# Patient Record
Sex: Female | Born: 1960 | Hispanic: No | Marital: Single | Smoking: Never smoker
Health system: Southern US, Community
[De-identification: ages and names within clinical notes are randomized; demographics above are authoritative.]

## PROBLEM LIST (undated history)

## (undated) DIAGNOSIS — R87619 Unspecified abnormal cytological findings in specimens from cervix uteri: Secondary | ICD-10-CM

## (undated) DIAGNOSIS — J45909 Unspecified asthma, uncomplicated: Secondary | ICD-10-CM

## (undated) DIAGNOSIS — J449 Chronic obstructive pulmonary disease, unspecified: Secondary | ICD-10-CM

## (undated) DIAGNOSIS — I82409 Acute embolism and thrombosis of unspecified deep veins of unspecified lower extremity: Secondary | ICD-10-CM

## (undated) DIAGNOSIS — IMO0002 Reserved for concepts with insufficient information to code with codable children: Secondary | ICD-10-CM

## (undated) DIAGNOSIS — I10 Essential (primary) hypertension: Secondary | ICD-10-CM

## (undated) DIAGNOSIS — I2699 Other pulmonary embolism without acute cor pulmonale: Secondary | ICD-10-CM

## (undated) HISTORY — PX: FOOT SURGERY: SHX648

## (undated) HISTORY — PX: OTHER SURGICAL HISTORY: SHX169

## (undated) HISTORY — DX: Reserved for concepts with insufficient information to code with codable children: IMO0002

## (undated) HISTORY — DX: Unspecified abnormal cytological findings in specimens from cervix uteri: R87.619

## (undated) HISTORY — DX: Other pulmonary embolism without acute cor pulmonale: I26.99

## (undated) HISTORY — PX: FOOT TENDON SURGERY: SHX958

## (undated) HISTORY — DX: Essential (primary) hypertension: I10

## (undated) HISTORY — DX: Acute embolism and thrombosis of unspecified deep veins of unspecified lower extremity: I82.409

## (undated) HISTORY — PX: TUBAL LIGATION: SHX77

---

## 2010-02-21 ENCOUNTER — Ambulatory Visit: Payer: Self-pay | Admitting: Diagnostic Radiology

## 2010-02-21 ENCOUNTER — Emergency Department (HOSPITAL_BASED_OUTPATIENT_CLINIC_OR_DEPARTMENT_OTHER): Admission: EM | Admit: 2010-02-21 | Discharge: 2010-02-21 | Payer: Self-pay | Admitting: Emergency Medicine

## 2010-09-09 ENCOUNTER — Emergency Department (HOSPITAL_BASED_OUTPATIENT_CLINIC_OR_DEPARTMENT_OTHER)
Admission: EM | Admit: 2010-09-09 | Discharge: 2010-09-09 | Payer: Self-pay | Source: Home / Self Care | Admitting: Emergency Medicine

## 2012-01-07 ENCOUNTER — Other Ambulatory Visit: Payer: Self-pay | Admitting: Obstetrics and Gynecology

## 2012-01-07 DIAGNOSIS — Z1231 Encounter for screening mammogram for malignant neoplasm of breast: Secondary | ICD-10-CM

## 2012-01-12 ENCOUNTER — Encounter: Payer: Self-pay | Admitting: *Deleted

## 2012-01-12 ENCOUNTER — Other Ambulatory Visit: Payer: Self-pay | Admitting: Obstetrics and Gynecology

## 2012-01-12 ENCOUNTER — Ambulatory Visit (HOSPITAL_COMMUNITY): Payer: Self-pay

## 2012-01-12 ENCOUNTER — Ambulatory Visit (INDEPENDENT_AMBULATORY_CARE_PROVIDER_SITE_OTHER): Payer: Self-pay | Admitting: *Deleted

## 2012-01-12 VITALS — BP 151/88 | HR 95 | Temp 98.3°F | Ht 67.0 in | Wt 233.5 lb

## 2012-01-12 DIAGNOSIS — Z1239 Encounter for other screening for malignant neoplasm of breast: Secondary | ICD-10-CM

## 2012-01-12 DIAGNOSIS — N6452 Nipple discharge: Secondary | ICD-10-CM

## 2012-01-12 DIAGNOSIS — N6459 Other signs and symptoms in breast: Secondary | ICD-10-CM

## 2012-01-12 NOTE — Progress Notes (Signed)
Complaints of bilateral nipple discharge.  Pap Smear:    Pap smear not performed today. Patients last Pap smear was 12/14/11 at the free cervical cancer screening at [facility] and normal. Per patient she had one abnormal Pap smear around 20 years ago that required cryo. No Pap smear results in EPIC.  Physical exam: Breasts Breasts symmetrical. No skin abnormalities bilateral breasts. No nipple retraction bilateral breasts. Yellowish colored bilateral nipple discharge when nipples are squeezed. Per patient has been having this discharge for around 10 years. Specimen sent to cytology. No lymphadenopathy. No lumps palpated bilateral breasts. No complaints of pain or tenderness on exam. Patient referred to the [facility] for Diagnostic Mammogram. Appointment scheduled for Thursday, Jan 14, 2012 at 1520.         Pelvic/Bimanual No Pap smear completed today since last Pap smear was 12/14/11 and normal. Pap smear not indicated per BCCCP guidelines.

## 2012-01-12 NOTE — Patient Instructions (Signed)
Taught patient how to perform BSE. Patient did not need a Pap smear today due to last Pap smear was 12/14/11. Let her know BCCCP will cover Pap smears every 3 years unless has a history of abnormal Pap smears. Since patients abnormal Pap smear was 20 years told patient is okay to wait 3 years for next Pap smear or if is concerned and would like one sooner can come to one of the free cervical cancer screenings offered in the spring or fall. Patient referred to the [facility] for Diagnostic Mammogram. Appointment scheduled for Thursday, Jan 14, 2012 at 1520. Patient aware of appointment and will be there. Let patient know will follow up with her within the next couple weeks with results. Patient verbalized understanding.

## 2012-01-18 ENCOUNTER — Ambulatory Visit
Admission: RE | Admit: 2012-01-18 | Discharge: 2012-01-18 | Disposition: A | Discharge status: Home / Self Care | Payer: No Typology Code available for payment source | Source: Ambulatory Visit | Attending: Obstetrics and Gynecology | Admitting: Obstetrics and Gynecology

## 2012-01-18 DIAGNOSIS — N6452 Nipple discharge: Secondary | ICD-10-CM

## 2012-01-19 ENCOUNTER — Telehealth: Payer: Self-pay

## 2012-01-19 NOTE — Telephone Encounter (Signed)
Called pt and left message to return the call to Christine Brannock @ [phone].  

## 2012-01-26 ENCOUNTER — Telehealth: Payer: Self-pay

## 2012-01-26 NOTE — Telephone Encounter (Signed)
Called pt and informed pt of no malignancy of nipple discharge and that she was recommended to do yearly screening mammograms.  Pt stated understanding and had no further questions.

## 2012-08-05 ENCOUNTER — Encounter (HOSPITAL_COMMUNITY): Payer: Self-pay

## 2012-08-05 ENCOUNTER — Emergency Department (HOSPITAL_COMMUNITY)
Admission: EM | Admit: 2012-08-05 | Discharge: 2012-08-05 | Disposition: A | Discharge status: Home / Self Care | Payer: Self-pay | Source: Home / Self Care

## 2012-08-05 DIAGNOSIS — M79604 Pain in right leg: Secondary | ICD-10-CM

## 2012-08-05 DIAGNOSIS — I10 Essential (primary) hypertension: Secondary | ICD-10-CM

## 2012-08-05 DIAGNOSIS — M79609 Pain in unspecified limb: Secondary | ICD-10-CM

## 2012-08-05 LAB — CBC WITH DIFFERENTIAL/PLATELET
Eosinophils Absolute: 0.2 10*3/uL (ref 0.0–0.7)
HCT: 44.5 % (ref 36.0–46.0)
Hemoglobin: 14.8 g/dL (ref 12.0–15.0)
Lymphocytes Relative: 26 % (ref 12–46)
Lymphs Abs: 2.3 10*3/uL (ref 0.7–4.0)
MCH: 29 pg (ref 26.0–34.0)
MCHC: 33.3 g/dL (ref 30.0–36.0)
MCV: 87.3 fL (ref 78.0–100.0)
Monocytes Relative: 7 % (ref 3–12)
Neutro Abs: 5.7 10*3/uL (ref 1.7–7.7)
WBC: 8.9 10*3/uL (ref 4.0–10.5)

## 2012-08-05 LAB — BASIC METABOLIC PANEL
Calcium: 10 mg/dL (ref 8.4–10.5)
Chloride: 98 mEq/L (ref 96–112)
Creatinine, Ser: 0.53 mg/dL (ref 0.50–1.10)

## 2012-08-05 MED ORDER — MELOXICAM 7.5 MG PO TABS: 7.5000 mg | ORAL_TABLET | Freq: Two times a day (BID) | ORAL | Status: DC

## 2012-08-05 MED ORDER — HYDROCHLOROTHIAZIDE 25 MG PO TABS: 25.0000 mg | ORAL_TABLET | Freq: Every day | ORAL | Status: DC

## 2012-08-05 NOTE — ED Notes (Signed)
Patient c/o pain in both legs from knees down.  Heel of Left foot is swollen. Has history of surgery to left foot.Back of right heel is swollen as well

## 2012-08-05 NOTE — ED Provider Notes (Signed)
History     CSN: [id number]  Arrival date & time 08/05/12  1555   First MD Initiated Contact with Patient 08/05/12 1727      Chief Complaint  Patient presents with  . Leg Pain   Ms. Kowaleski has had history of surgery in both her legs. Her left ankle was cutoff at age 50 and was reconstructed by taking skin from her right thigh. Her left leg had a history of blood clot and she says that one of her pains was taken out a few years ago in [place]. Patient has been here in [place] for last 6 years and has not had any primary care physician. She sees that both her feets have started hurting. The pain is located at the bottom of both her feet, nonradiating, sharp or burning in quality, persistent throughout the day, progressively getting worse, associated with some swelling in her left foot and swelling in right foot and calf. Patient had been on blood pressure medication but has been unable to get a refill.   (Consider location/radiation/quality/duration/timing/severity/associated sxs/prior treatment) HPI  Past Medical History  Diagnosis Date  . Abnormal Pap smear     cryotherapy  . Hypertension     Past Surgical History  Procedure Date  . Tubal ligation   . Skin graphs   . Vein removed     Family History  Problem Relation Age of Onset  . Breast cancer Maternal Grandmother   . Diabetes Father   . Heart disease Father   . Diabetes Brother   . Hypertension Daughter     History  Substance Use Topics  . Smoking status: Never Smoker   . Smokeless tobacco: Never Used  . Alcohol Use: No    OB History    Grav Para Term Preterm Abortions TAB SAB Ect Mult Living   6 6 6       6       Review of Systems  Constitutional: Negative for fever, activity change and appetite change.  HENT: Negative for sore throat.   Respiratory: Negative for cough and shortness of breath.   Cardiovascular: Negative for chest pain and leg swelling.  Gastrointestinal: Negative for nausea, abdominal  pain, diarrhea, constipation and abdominal distention.  Genitourinary: Negative for frequency, hematuria and difficulty urinating.  Musculoskeletal: Positive for myalgias, joint swelling and arthralgias.  Neurological: Negative for dizziness and headaches.  Psychiatric/Behavioral: Negative for suicidal ideas and behavioral problems.    Allergies  Review of patient's allergies indicates no known allergies.  Home Medications   Current Outpatient Rx  Name  Route  Sig  Dispense  Refill  . HYDROCHLOROTHIAZIDE 25 MG PO TABS   Oral   Take 25 mg by mouth daily.           BP 148/91  Pulse 110  Temp 98.6 F (37 C) (Oral)  Resp 21  SpO2 100%  LMP 07/07/2012  Physical Exam  Constitutional: She is oriented to person, place, and time. She appears well-developed and well-nourished.  HENT:  Head: Normocephalic and atraumatic.  Eyes: Conjunctivae normal and EOM are normal. Pupils are equal, round, and reactive to light. No scleral icterus.  Neck: Normal range of motion. Neck supple. No JVD present. No thyromegaly present.  Cardiovascular: Normal rate, regular rhythm, normal heart sounds and intact distal pulses.  Exam reveals no gallop and no friction rub.   No murmur heard. Pulmonary/Chest: Effort normal and breath sounds normal. No respiratory distress. She has no wheezes. She has no rales.  Abdominal: Soft. Bowel sounds are normal. She exhibits no distension and no mass. There is no tenderness. There is no rebound and no guarding.  Musculoskeletal: Normal range of motion. She exhibits edema (bilateral 1+ pitting edema up to mid shin).       Legs:      Feet:  Lymphadenopathy:    She has no cervical adenopathy.  Neurological: She is alert and oriented to person, place, and time.  Psychiatric: She has a normal mood and affect. Her behavior is normal.    ED Course  Procedures (including critical care time)  Labs Reviewed - No data to display No results found.   No diagnosis  found.    MDM  Patient most likely has increased swelling from dis continuation of her HCTZ. There is no signs of an active infection at this time. I will do basic metabolic profile and CBC with differential. I will also put in the future order for x-ray of her bilateral ankle joints. Followup as needed.        Lars Mage, MD 08/05/12 1757

## 2012-08-09 NOTE — ED Notes (Signed)
Spoke with patient today.  bloodwork was normal sugar a little elevated  Can obtain ted stockings at local pharmacy.

## 2013-03-22 ENCOUNTER — Emergency Department (HOSPITAL_BASED_OUTPATIENT_CLINIC_OR_DEPARTMENT_OTHER)
Admission: EM | Admit: 2013-03-22 | Discharge: 2013-03-22 | Disposition: A | Discharge status: Home / Self Care | Payer: Self-pay | Attending: Emergency Medicine | Admitting: Emergency Medicine

## 2013-03-22 ENCOUNTER — Emergency Department (HOSPITAL_BASED_OUTPATIENT_CLINIC_OR_DEPARTMENT_OTHER): Payer: Self-pay

## 2013-03-22 ENCOUNTER — Encounter (HOSPITAL_BASED_OUTPATIENT_CLINIC_OR_DEPARTMENT_OTHER): Payer: Self-pay | Admitting: Family Medicine

## 2013-03-22 DIAGNOSIS — I10 Essential (primary) hypertension: Secondary | ICD-10-CM | POA: Insufficient documentation

## 2013-03-22 DIAGNOSIS — R209 Unspecified disturbances of skin sensation: Secondary | ICD-10-CM | POA: Insufficient documentation

## 2013-03-22 DIAGNOSIS — IMO0001 Reserved for inherently not codable concepts without codable children: Secondary | ICD-10-CM | POA: Insufficient documentation

## 2013-03-22 DIAGNOSIS — M7121 Synovial cyst of popliteal space [Baker], right knee: Secondary | ICD-10-CM

## 2013-03-22 DIAGNOSIS — M7989 Other specified soft tissue disorders: Secondary | ICD-10-CM | POA: Insufficient documentation

## 2013-03-22 DIAGNOSIS — R0601 Orthopnea: Secondary | ICD-10-CM | POA: Insufficient documentation

## 2013-03-22 DIAGNOSIS — R0602 Shortness of breath: Secondary | ICD-10-CM | POA: Insufficient documentation

## 2013-03-22 DIAGNOSIS — M7120 Synovial cyst of popliteal space [Baker], unspecified knee: Secondary | ICD-10-CM | POA: Insufficient documentation

## 2013-03-22 DIAGNOSIS — R0789 Other chest pain: Secondary | ICD-10-CM | POA: Insufficient documentation

## 2013-03-22 DIAGNOSIS — Z79899 Other long term (current) drug therapy: Secondary | ICD-10-CM | POA: Insufficient documentation

## 2013-03-22 LAB — BASIC METABOLIC PANEL
BUN: 9 mg/dL (ref 6–23)
CO2: 27 mEq/L (ref 19–32)
Calcium: 9.8 mg/dL (ref 8.4–10.5)
GFR calc Af Amer: >90 mL/min (ref >90)
GFR calc non Af Amer: >90 mL/min (ref >90)
Potassium: 3.6 mEq/L (ref 3.5–5.1)
Sodium: 139 mEq/L (ref 135–145)

## 2013-03-22 LAB — CBC
HCT: 42.4 % (ref 36.0–46.0)
Hemoglobin: 14.3 g/dL (ref 12.0–15.0)
MCHC: 33.7 g/dL (ref 30.0–36.0)
Platelets: 258 10*3/uL (ref 150–400)
RBC: 4.93 MIL/uL (ref 3.87–5.11)

## 2013-03-22 LAB — TROPONIN I: Troponin I: <0.3 ng/mL (ref <0.30)

## 2013-03-22 MED ORDER — ONDANSETRON HCL 4 MG/2ML IJ SOLN
4.0000 mg | Freq: Once | INTRAMUSCULAR | Status: AC
  Administered 2013-03-22: 4 mg via INTRAVENOUS
  Filled 2013-03-22: qty 2

## 2013-03-22 MED ORDER — MORPHINE SULFATE 4 MG/ML IJ SOLN
4.0000 mg | Freq: Once | INTRAMUSCULAR | Status: AC
  Administered 2013-03-22: 4 mg via INTRAVENOUS
  Filled 2013-03-22: qty 1

## 2013-03-22 MED ORDER — HYDROCODONE-ACETAMINOPHEN 5-325 MG PO TABS: 1.0000 | ORAL_TABLET | Freq: Three times a day (TID) | ORAL | Status: DC | PRN

## 2013-03-22 MED ORDER — SODIUM CHLORIDE 0.9 % IV BOLUS (SEPSIS)
500.0000 mL | Freq: Once | INTRAVENOUS | Status: AC
  Administered 2013-03-22: 500 mL via INTRAVENOUS

## 2013-03-22 NOTE — ED Notes (Signed)
Pt c/o pain behind the right knee for "a while" but worse past week. Pt also c/o heel pain and ankle swelling. Pt sts she does not have a PCP.

## 2013-03-22 NOTE — ED Provider Notes (Signed)
History    CSN: [id number] Arrival date & time 03/22/13  1131  First MD Initiated Contact with Patient 03/22/13 1204     Chief Complaint  Patient presents with  . Leg Pain   (Consider location/radiation/quality/duration/timing/severity/associated sxs/prior Treatment) The history is provided by the patient. No language interpreter was used.  Sandra Sanford is a 52 y/o F with PMHx of HTN presenting to the ED with right leg pain that has been ongoing for the past week. Patient reported that she has a constant burning sensation to the back of her right knee that does not radiate, stated that she has a pain running down her right leg - stated that the pain gets worse when she is walking, cramping sensation. Patient reported that she has noticed that she has been having swelling to the right leg that has been ongoing for the past couple of days - reported that she normally has swelling at the end of the day when standing for long periods of time, but reported that she has been having swelling all throughout the day even when she is at rest. Patient reported that there is pain in the right leg when she is at rest. Patient stated that she has been having a pain in her left heel - stated that she had her left heel removed in a tractor accident when she was 52 years old - stated that she noticed discomfort when walking on the left foot x 1 week - described as a throbbing sensation without radiation, pain when apply pressure is worse, reported that she feels a "bump" on the heel. Patient reported that she has been using Tylenol, Ibuprofen, voltaren gel, Bengay, heating and cold pads. During interview patient reported chest tightness and shortness of breath - stated that she has been experiencing shortness of breath with motion. Stated that when she sleeps at night she has to use at least 4-5 pillows to prop her up. Denied fever, chills, weakness, falls, injuries, blurred vision, visual distortions, tingling,  difficulty breathing.  Patient reported that the last time she was seen by a doctor was approximately 5-6 years ago.  PCP none   Past Medical History  Diagnosis Date  . Abnormal Pap smear     cryotherapy  . Hypertension    Past Surgical History  Procedure Laterality Date  . Tubal ligation    . Skin graphs    . Vein removed     Family History  Problem Relation Age of Onset  . Breast cancer Maternal Grandmother   . Diabetes Father   . Heart disease Father   . Diabetes Brother   . Hypertension Daughter    History  Substance Use Topics  . Smoking status: Never Smoker   . Smokeless tobacco: Never Used  . Alcohol Use: No   OB History   Grav Para Term Preterm Abortions TAB SAB Ect Mult Living   6 6 6       6      Review of Systems  Constitutional: Negative for fever and chills.  HENT: Negative for neck pain.   Eyes: Negative for visual disturbance.  Respiratory: Positive for shortness of breath. Negative for cough.   Cardiovascular: Positive for chest pain and leg swelling.  Gastrointestinal: Negative for nausea, vomiting, abdominal pain and diarrhea.  Musculoskeletal: Positive for myalgias (right leg pain).  Neurological: Positive for numbness (baseline for patient). Negative for dizziness, weakness and headaches.    Allergies  Review of patient's allergies indicates no known allergies.  Home Medications   Current Outpatient Rx  Name  Route  Sig  Dispense  Refill  . hydrochlorothiazide (HYDRODIURIL) 25 MG tablet   Oral   Take 1 tablet (25 mg total) by mouth daily.   30 tablet   5   . HYDROcodone-acetaminophen (NORCO) 5-325 MG per tablet   Oral   Take 1 tablet by mouth every 8 (eight) hours as needed for pain.   11 tablet   0   . meloxicam (MOBIC) 7.5 MG tablet   Oral   Take 1 tablet (7.5 mg total) by mouth 2 (two) times daily.   30 tablet   1    BP 163/95  Pulse 78  Temp(Src) 98.4 F (36.9 C) (Oral)  Resp 18  Ht 5\' 7"  (1.702 m)  Wt 230 lb  (104.327 kg)  BMI 36.01 kg/m2  SpO2 99%  LMP 01/30/2013 Physical Exam  Nursing note and vitals reviewed. Constitutional: She is oriented to person, place, and time. She appears well-developed and well-nourished. No distress.  HENT:  Head: Normocephalic and atraumatic.  Mouth/Throat: Oropharynx is clear and moist.  Eyes: Conjunctivae and EOM are normal. Pupils are equal, round, and reactive to light. Right eye exhibits no discharge. Left eye exhibits no discharge.  Neck: Normal range of motion. Neck supple.  Cardiovascular: Normal rate, regular rhythm and normal heart sounds.  Exam reveals no friction rub.   No murmur heard. Pulses:      Radial pulses are 2+ on the right side, and 2+ on the left side.       Dorsalis pedis pulses are 2+ on the right side, and 2+ on the left side.  Mild ankle swelling noted to the right side Negative pitting edema Positive Homan's sign noted to the right leg    Pulmonary/Chest: Effort normal and breath sounds normal. No respiratory distress. She has no wheezes. She has no rales. She exhibits tenderness.    Musculoskeletal: Normal range of motion.  Neurological: She is alert and oriented to person, place, and time. No cranial nerve deficit. She exhibits normal muscle tone. Coordination normal.  Strength 5+/5+ with resistance Sensation intact   Asymmetry to the left side of the face when smiling noted - left sided drooping - patient has history of Bell's Palsy x 16 years  Skin: Skin is warm and dry. No rash noted. She is not diaphoretic. No erythema.  Left heel reconstruction surgery noted - occurred when patient was 52 years old. Negative swelling, erythema, warmth, - negative signs of infection. Pain upon palpation to the left heel.   Psychiatric: She has a normal mood and affect. Her behavior is normal. Thought content normal.    ED Course  Procedures (including critical care time)   Date: 03/22/2013  Rate: 91  Rhythm: normal sinus rhythm  QRS  Axis: normal  Intervals: normal  ST/T Wave abnormalities: normal  Conduction Disutrbances:none  Narrative Interpretation:   Old EKG Reviewed: none available   Labs Reviewed  BASIC METABOLIC PANEL - Abnormal; Notable for the following:    Glucose, Bld 145 (*)    All other components within normal limits  CBC  TROPONIN I  TROPONIN I   Dg Chest 2 View  03/22/2013   *RADIOLOGY REPORT*  Clinical Data: Shortness of breath.  CHEST - 2 VIEW  Comparison: PA and lateral chest 02/21/2010.  Findings: The lungs are clear.  Heart size is normal.  No pneumothorax or pleural effusion.  IMPRESSION: No acute disease.   Original Report  Authenticated By: Holley Dexter, M.D.   US Venous Img Lower Unilateral Right  03/22/2013   *RADIOLOGY REPORT*  Clinical Data: Right calf and knee pain and swelling.  History of greater saphenous vein removal.  RIGHT LOWER EXTREMITY VENOUS DOPPLER ULTRASOUND  Technique: Gray-scale sonography with compression, as well as color and duplex ultrasound, were performed to evaluate the deep venous system from the level of the common femoral vein through the popliteal and proximal calf veins.  Comparison: 02/21/2010  Findings:  Normal compressibility of  the common femoral, superficial femoral, and popliteal veins, as well as the proximal calf veins.  No filling defects to suggest DVT on grayscale or color Doppler imaging.  Doppler waveforms show normal direction of venous flow, normal respiratory phasicity and response to augmentation. There is an elongated  19 x 36 x 65 mm hypoechoic collection in the medial popliteal fossa.  IMPRESSION: 1.  No evidence of  lower extremity deep vein thrombosis. 2.  Baker's cyst.   Original Report Authenticated By: D. Andria Rhein, MD   1. Baker's cyst, right   2. Chest tightness   3. Orthopnea   4. HTN (hypertension)     MDM  Patient presenting to the ED with right leg pain, chest tightness, and shortness of breath. Pain reproducible upon  palpation to the left side of the chest. Patient in NAD. Alert and oriented. Lungs clear. Heart normal. Positive Homan's sign to the right leg. Distal pulses palpable. Sensation intact. Strength intact. Negative sign of infection noted to the left heel - negative erythema, inflammation, swelling, drainage.   EKG negative ischemic changes noted - two sets of troponins negative elevation. BMP negative findings. CBC negative findings. Chest xray negative acute findings. Doppler of RLE negative for DVT - baker's cyst noted. Patients pain controlled in ED setting. Patient stable, afebrile. Negative pleuritic chest pain, negative tachycardia, negative tachypnea, pulse ox adequate on room air - doubt PE. Doubt ACS - pain reproducible upon palpation to the left side of the chest. Leg pain secondary to baker's cyst. Patient's discomfort controlled in ED setting. Discharged patient. Referred to cardiology for echo and stress test, referral to orthopedics for right knee pain and central [place] surgery regarding left heel discomfort. Discussed with patient how to take pain medications - precautions and disposal. Discussed with patient to rest and stay hydrated. Discussed with patient to continue to monitor symptoms and if symptoms are to worsen or change to report back to the ED - strict return instructions given.  Patient agreed to plan of care, understood, all questions answered.       Raymon Mutton, PA-C 03/22/13 2350

## 2013-03-22 NOTE — ED Notes (Signed)
Patient transported to Ultrasound 

## 2013-03-24 NOTE — ED Provider Notes (Signed)
Medical screening examination/treatment/procedure(s) were performed by non-physician practitioner and as supervising physician I was immediately available for consultation/collaboration.   Pavneet Markwood, MD 03/24/13 1844 

## 2013-03-30 ENCOUNTER — Other Ambulatory Visit: Payer: Self-pay | Admitting: Internal Medicine

## 2013-03-30 NOTE — Telephone Encounter (Signed)
Refill request HCTZ

## 2013-04-10 ENCOUNTER — Other Ambulatory Visit (HOSPITAL_COMMUNITY): Payer: Self-pay | Admitting: Orthopaedic Surgery

## 2013-04-10 DIAGNOSIS — M25561 Pain in right knee: Secondary | ICD-10-CM

## 2013-04-14 ENCOUNTER — Ambulatory Visit (HOSPITAL_COMMUNITY)
Admission: RE | Admit: 2013-04-14 | Discharge: 2013-04-14 | Disposition: A | Discharge status: Home / Self Care | Payer: Self-pay | Source: Ambulatory Visit | Attending: Orthopaedic Surgery | Admitting: Orthopaedic Surgery

## 2013-04-14 DIAGNOSIS — M25569 Pain in unspecified knee: Secondary | ICD-10-CM | POA: Insufficient documentation

## 2013-04-14 DIAGNOSIS — M2240 Chondromalacia patellae, unspecified knee: Secondary | ICD-10-CM | POA: Insufficient documentation

## 2013-04-14 DIAGNOSIS — M25561 Pain in right knee: Secondary | ICD-10-CM

## 2013-04-14 DIAGNOSIS — M9420 Chondromalacia, unspecified site: Secondary | ICD-10-CM | POA: Insufficient documentation

## 2013-04-14 DIAGNOSIS — M1710 Unilateral primary osteoarthritis, unspecified knee: Secondary | ICD-10-CM | POA: Insufficient documentation

## 2013-04-14 DIAGNOSIS — M7120 Synovial cyst of popliteal space [Baker], unspecified knee: Secondary | ICD-10-CM | POA: Insufficient documentation

## 2013-04-14 DIAGNOSIS — M6740 Ganglion, unspecified site: Secondary | ICD-10-CM | POA: Insufficient documentation

## 2013-04-14 DIAGNOSIS — I8390 Asymptomatic varicose veins of unspecified lower extremity: Secondary | ICD-10-CM | POA: Insufficient documentation

## 2013-08-28 ENCOUNTER — Encounter (HOSPITAL_COMMUNITY): Payer: Self-pay | Admitting: Emergency Medicine

## 2013-08-28 ENCOUNTER — Emergency Department (HOSPITAL_COMMUNITY): Payer: Self-pay

## 2013-08-28 ENCOUNTER — Emergency Department (HOSPITAL_COMMUNITY)
Admission: EM | Admit: 2013-08-28 | Discharge: 2013-08-29 | Disposition: A | Discharge status: Home / Self Care | Payer: Self-pay | Attending: Emergency Medicine | Admitting: Emergency Medicine

## 2013-08-28 DIAGNOSIS — H5710 Ocular pain, unspecified eye: Secondary | ICD-10-CM | POA: Insufficient documentation

## 2013-08-28 DIAGNOSIS — Z79899 Other long term (current) drug therapy: Secondary | ICD-10-CM | POA: Insufficient documentation

## 2013-08-28 DIAGNOSIS — R519 Headache, unspecified: Secondary | ICD-10-CM

## 2013-08-28 DIAGNOSIS — R51 Headache: Secondary | ICD-10-CM | POA: Insufficient documentation

## 2013-08-28 DIAGNOSIS — I10 Essential (primary) hypertension: Secondary | ICD-10-CM | POA: Insufficient documentation

## 2013-08-28 MED ORDER — METOCLOPRAMIDE HCL 5 MG/ML IJ SOLN
10.0000 mg | Freq: Once | INTRAMUSCULAR | Status: AC
  Administered 2013-08-29: 10 mg via INTRAVENOUS
  Filled 2013-08-28: qty 2

## 2013-08-28 MED ORDER — DIPHENHYDRAMINE HCL 50 MG/ML IJ SOLN
25.0000 mg | Freq: Once | INTRAMUSCULAR | Status: AC
  Administered 2013-08-29: 25 mg via INTRAVENOUS
  Filled 2013-08-28: qty 1

## 2013-08-28 MED ORDER — DEXAMETHASONE SODIUM PHOSPHATE 4 MG/ML IJ SOLN
10.0000 mg | Freq: Once | INTRAMUSCULAR | Status: AC
  Administered 2013-08-29: 10 mg via INTRAVENOUS
  Filled 2013-08-28: qty 3

## 2013-08-28 MED ORDER — SODIUM CHLORIDE 0.9 % IV BOLUS (SEPSIS)
1000.0000 mL | Freq: Once | INTRAVENOUS | Status: AC
Start: 2013-08-28 — End: 2013-08-29
  Administered 2013-08-29: 1000 mL via INTRAVENOUS

## 2013-08-28 NOTE — ED Provider Notes (Signed)
CSN: [id number]     Arrival date & time 08/28/13  1859 History   First MD Initiated Contact with Patient 08/28/13 2311     Chief Complaint  Patient presents with  . Headache   (Consider location/radiation/quality/duration/timing/severity/associated sxs/prior Treatment) HPI Hx per PT -  HA R sided located behind her R eye, denies h/o similar HA in the past, onset this am worsening tonight despite alieve at home. Mod to severe. No F/C, no rash. No trauma, no syncope. No sudden onset or thunderclap HA. No associated cough, sore throat or congestion.  No N/V. No change in vision but does hurt to look side to side/ with eye movements. No photophobia.   Past Medical History  Diagnosis Date  . Abnormal Pap smear     cryotherapy  . Hypertension    Past Surgical History  Procedure Laterality Date  . Tubal ligation    . Skin graphs    . Vein removed     Family History  Problem Relation Age of Onset  . Breast cancer Maternal Grandmother   . Diabetes Father   . Heart disease Father   . Diabetes Brother   . Hypertension Daughter    History  Substance Use Topics  . Smoking status: Never Smoker   . Smokeless tobacco: Never Used  . Alcohol Use: No   OB History   Grav Para Term Preterm Abortions TAB SAB Ect Mult Living   6 6 6       6      Review of Systems  Constitutional: Negative for fever and chills.  Eyes: Positive for pain. Negative for visual disturbance.  Respiratory: Negative for shortness of breath.   Cardiovascular: Negative for chest pain.  Gastrointestinal: Negative for vomiting and abdominal pain.  Genitourinary: Negative for dysuria.  Musculoskeletal: Negative for back pain, neck pain and neck stiffness.  Skin: Negative for rash.  Neurological: Positive for headaches. Negative for syncope, facial asymmetry, speech difficulty, weakness and numbness.  All other systems reviewed and are negative.    Allergies  Review of patient's allergies indicates no known  allergies.  Home Medications   Current Outpatient Rx  Name  Route  Sig  Dispense  Refill  . hydrochlorothiazide (HYDRODIURIL) 25 MG tablet      TAKE ONE TABLET BY MOUTH EVERY DAY   30 tablet   0    BP 153/77  Pulse 109  Temp(Src) 98.6 F (37 C) (Oral)  Resp 16  Ht 5\' 7"  (1.702 m)  Wt 230 lb (104.327 kg)  BMI 36.01 kg/m2  SpO2 98%  LMP 05/29/2013 Physical Exam  Constitutional: She is oriented to person, place, and time. She appears well-developed and well-nourished.  HENT:  Head: Normocephalic and atraumatic.  Mouth/Throat: Oropharynx is clear and moist.  No tenderness over temporal arteries, no TMJ tenderness, no trismus.   Eyes: Conjunctivae are normal. Pupils are equal, round, and reactive to light.  Pain with EOMI of R eye, no periorbital erythema or swelling, although some tenderness to periorbital region. Lids and lashes clear. No conjunctival injection, pupils equal and reactive  Neck: Normal range of motion and full passive range of motion without pain. Neck supple. No thyromegaly present.  No meningismus  Cardiovascular: Normal rate, regular rhythm, S1 normal, S2 normal and intact distal pulses.   Pulmonary/Chest: Effort normal and breath sounds normal.  Abdominal: Soft. Bowel sounds are normal. There is no tenderness. There is no CVA tenderness.  Musculoskeletal: Normal range of motion.  Neurological: She is  alert and oriented to person, place, and time. She has normal strength and normal reflexes. No cranial nerve deficit or sensory deficit. She displays a negative Romberg sign. GCS eye subscore is 4. GCS verbal subscore is 5. GCS motor subscore is 6.  Normal Gait  Skin: Skin is warm and dry. No rash noted. No cyanosis. Nails show no clubbing.  Psychiatric: She has a normal mood and affect. Her speech is normal and behavior is normal.    ED Course  Procedures (including critical care time) Labs Review Labs Reviewed  POCT I-STAT, CHEM 8 - Abnormal; Notable for  the following:    Potassium 3.2 (*)    Glucose, Bld 104 (*)    Calcium, Ion 1.25 (*)    Hemoglobin 15.3 (*)    All other components within normal limits  CBC   Imaging Review Ct Orbits W/cm  08/29/2013   CLINICAL DATA:  Right temporal and eye pain.  No injury.  EXAM: CT ORBITS WITH CONTRAST  TECHNIQUE: Multidetector CT imaging of the orbits was performed following the bolus administration of intravenous contrast.  CONTRAST:  OMNIPAQUE IOHEXOL 300 MG/ML  SOLN  COMPARISON:  None.  FINDINGS: The globes and extraocular muscles appear symmetrical and intact. No abnormal soft tissue swelling or contrast enhancement. No enhancing mass lesion is appreciated. Minimal mucosal thickening in the maxillary antra. Visualized paranasal sinuses are otherwise clear. No acute air-fluid levels are demonstrated. Visualized mastoid air cells are clear. Temporal bone structures appear intact and symmetrical. No orbital fracture or displacement is demonstrated. There is been previous resection or resorption of the inferior nasal septum and inferior nasal turbinates bilaterally. Mild degenerative changes in the temporomandibular joints.  IMPRESSION: No acute intra rolled process demonstrated. Mild chronic inflammatory changes in the paranasal sinuses with postoperative change in the nodes.   Electronically Signed   By: Burman Nieves M.D.   On: 08/29/2013 01:41   IVFs, IV HA cocktail, on recheck still having pain, IV Dilaudid provided.   Tonopen - R eye 17, 18  4:19 AM pain improved, no neuro deficits or vision complaints. PLan d/c home RX motrin and Ultram. Outpatient referral and strict return precautions provided  MDM  Dx: R sided HA  CT orbits, labs obtained/ reviewed Pain improved with IVFs and narcotics VS and nurses notes reviewed and considered.      Sunnie Nielsen, MD 08/29/13 [phone]

## 2013-08-28 NOTE — ED Notes (Signed)
Pt reports a sudden constant extreme pain to R side of head that started last night at 7p.m. Pt took aleeve without any relief. Pt denies n/v or vision changes.

## 2013-08-29 ENCOUNTER — Encounter (HOSPITAL_COMMUNITY): Payer: Self-pay

## 2013-08-29 LAB — POCT I-STAT, CHEM 8
BUN: 12 mg/dL (ref 6–23)
Creatinine, Ser: 0.7 mg/dL (ref 0.50–1.10)
Hemoglobin: 15.3 g/dL — ABNORMAL HIGH (ref 12.0–15.0)
Potassium: 3.2 mEq/L — ABNORMAL LOW (ref 3.7–5.3)
Sodium: 142 mEq/L (ref 137–147)

## 2013-08-29 LAB — CBC
Hemoglobin: 14.8 g/dL (ref 12.0–15.0)
MCHC: 33.8 g/dL (ref 30.0–36.0)
RBC: 5.1 MIL/uL (ref 3.87–5.11)
WBC: 9.6 10*3/uL (ref 4.0–10.5)

## 2013-08-29 MED ORDER — TRAMADOL HCL 50 MG PO TABS: 50.0000 mg | ORAL_TABLET | Freq: Four times a day (QID) | ORAL | Status: DC | PRN

## 2013-08-29 MED ORDER — ONDANSETRON HCL 4 MG/2ML IJ SOLN
4.0000 mg | Freq: Once | INTRAMUSCULAR | Status: AC
  Administered 2013-08-29: 4 mg via INTRAVENOUS
  Filled 2013-08-29: qty 2

## 2013-08-29 MED ORDER — TETRACAINE HCL 0.5 % OP SOLN: 1.0000 [drp] | Freq: Once | OPHTHALMIC | Status: DC

## 2013-08-29 MED ORDER — IBUPROFEN 800 MG PO TABS: 800.0000 mg | ORAL_TABLET | Freq: Three times a day (TID) | ORAL | Status: DC

## 2013-08-29 MED ORDER — POTASSIUM CHLORIDE CRYS ER 20 MEQ PO TBCR
40.0000 meq | EXTENDED_RELEASE_TABLET | Freq: Once | ORAL | Status: AC
  Administered 2013-08-29: 40 meq via ORAL
  Filled 2013-08-29: qty 2

## 2013-08-29 MED ORDER — TETRACAINE HCL 0.5 % OP SOLN
1.0000 [drp] | Freq: Once | OPHTHALMIC | Status: DC
  Filled 2013-08-29: qty 2

## 2013-08-29 MED ORDER — FLUORESCEIN SODIUM 1 MG OP STRP
1.0000 | ORAL_STRIP | Freq: Once | OPHTHALMIC | Status: AC
  Administered 2013-08-29: 1 via OPHTHALMIC
  Filled 2013-08-29: qty 1

## 2013-08-29 MED ORDER — PROPARACAINE HCL 0.5 % OP SOLN
1.0000 [drp] | Freq: Once | OPHTHALMIC | Status: AC
  Administered 2013-08-29: 1 [drp] via OPHTHALMIC
  Filled 2013-08-29 (×2): qty 15

## 2013-08-29 MED ORDER — IOHEXOL 300 MG/ML  SOLN
100.0000 mL | Freq: Once | INTRAMUSCULAR | Status: AC | PRN
  Administered 2013-08-29: 100 mL via INTRAVENOUS

## 2013-08-29 MED ORDER — HYDROMORPHONE HCL PF 1 MG/ML IJ SOLN
1.0000 mg | Freq: Once | INTRAMUSCULAR | Status: AC
  Administered 2013-08-29: 1 mg via INTRAVENOUS
  Filled 2013-08-29: qty 1

## 2013-08-29 NOTE — ED Notes (Signed)
Visiual acuity, Pt. At 20/25 with R and L eyes.

## 2013-11-29 ENCOUNTER — Other Ambulatory Visit: Payer: Self-pay | Admitting: Family Medicine

## 2013-11-29 DIAGNOSIS — Z1231 Encounter for screening mammogram for malignant neoplasm of breast: Secondary | ICD-10-CM

## 2013-12-19 ENCOUNTER — Ambulatory Visit: Payer: Self-pay

## 2014-07-02 ENCOUNTER — Encounter (HOSPITAL_COMMUNITY): Payer: Self-pay

## 2014-12-08 IMAGING — CT CT ORBITS W/ CM
1 series · 15 of 30 positions shown, 19 images · IV contrast (omnipaque)
Comparison: None.

CLINICAL DATA: Right temporal and eye pain.  No injury.

EXAM:
CT ORBITS WITH CONTRAST
TECHNIQUE: Multidetector CT imaging of the orbits was performed following the
bolus administration of intravenous contrast.
CONTRAST:  100mL OMNIPAQUE IOHEXOL 300 MG/ML  SOLN

[Series 3: facial st · axial · 0.33mm/px · z∈[+1267,+1341]mm · 15 of 41 slices shown, 19 images]
[im 2/41  brain]
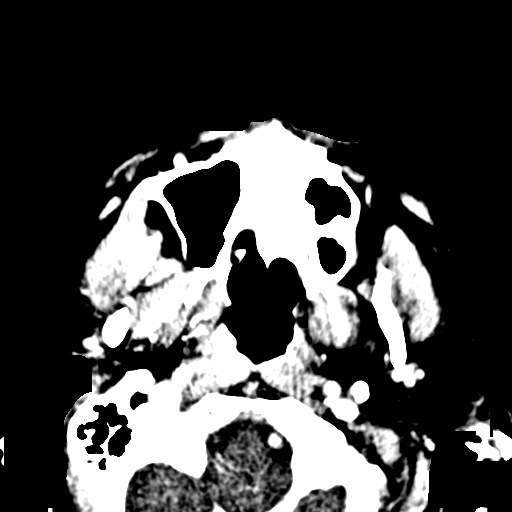
[im 2/41  bone]
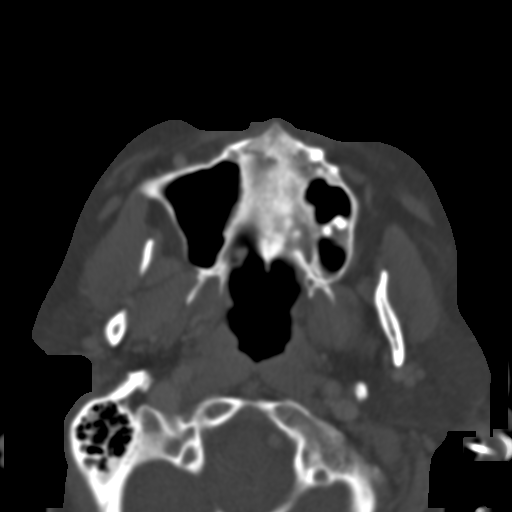
[im 5/41  bone]
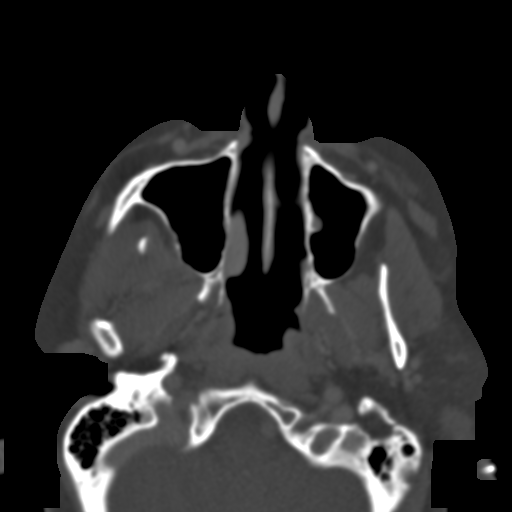
[im 7/41  bone]
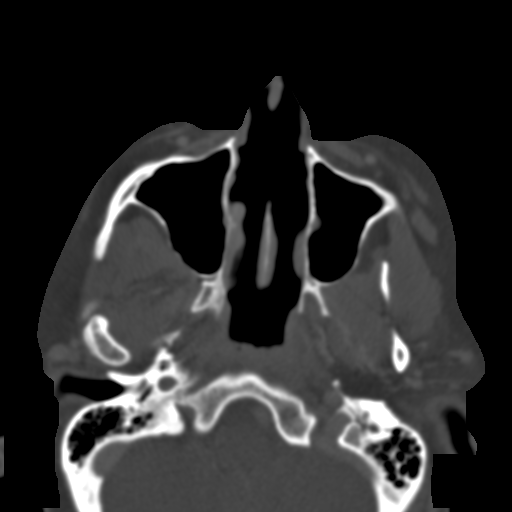
[im 10/41  bone]
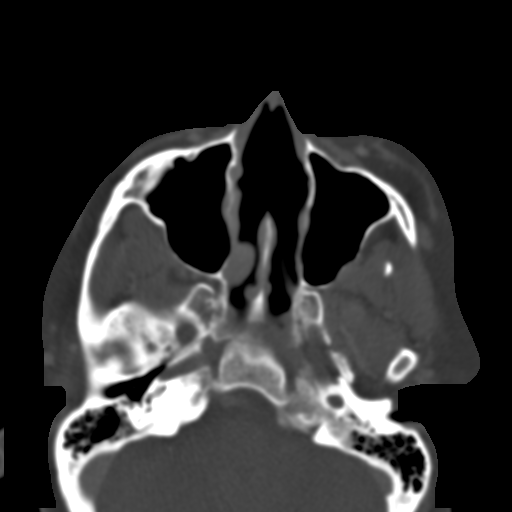
[im 13/41  brain]
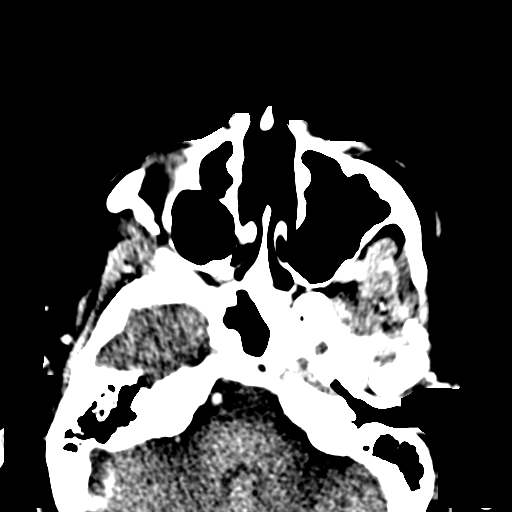
[im 13/41  bone]
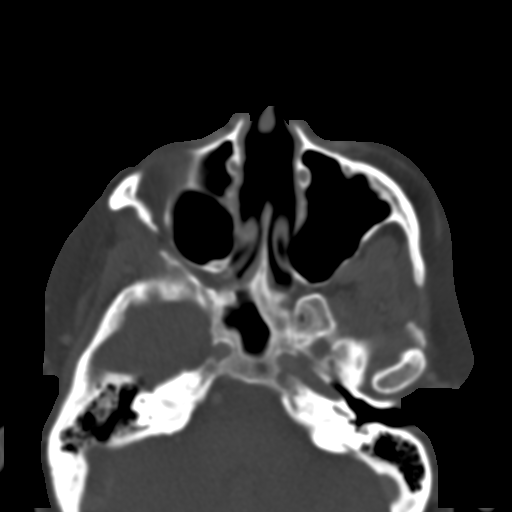
[im 16/41  bone]
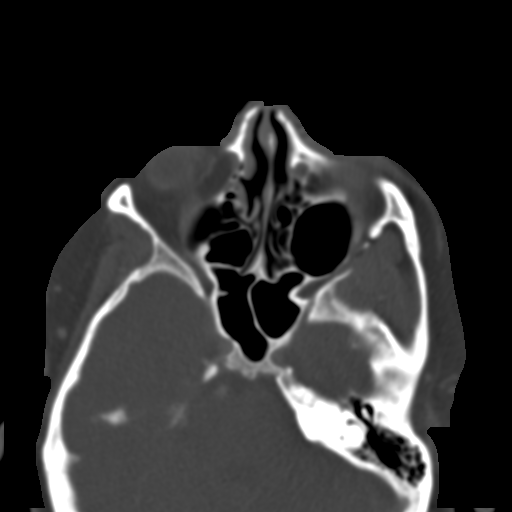
[im 18/41  bone]
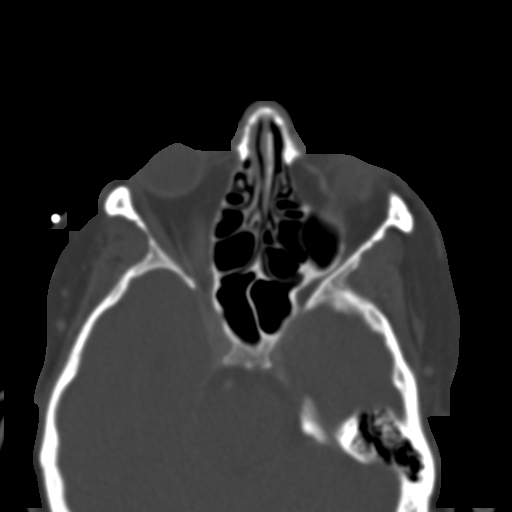
[im 21/41  bone]
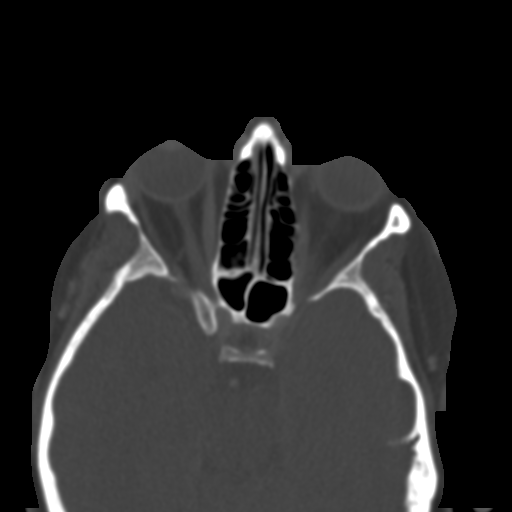
[im 23/41  brain]
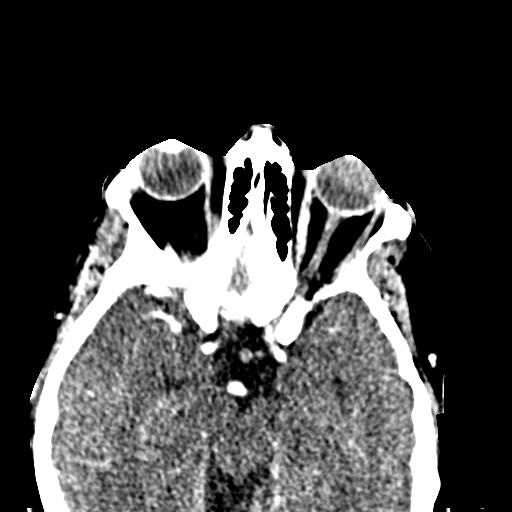
[im 23/41  bone]
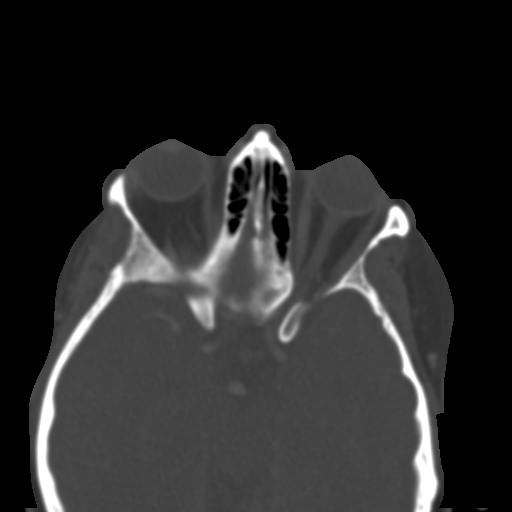
[im 25/41  bone]
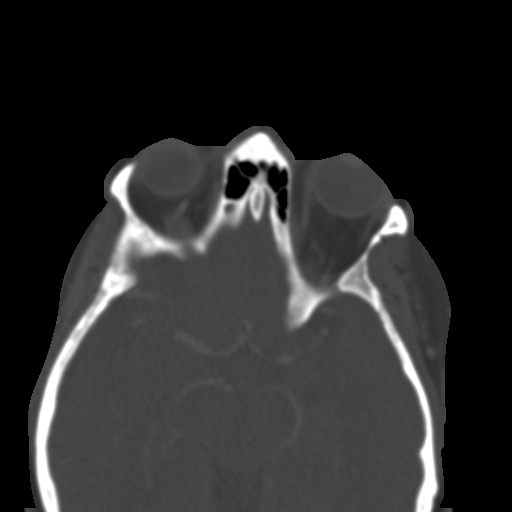
[im 28/41  bone]
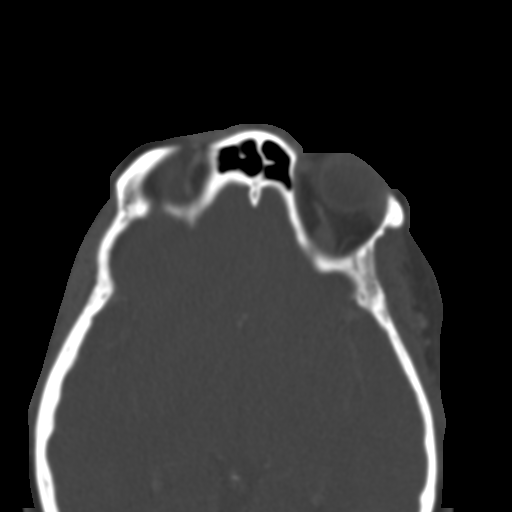
[im 31/41  bone]
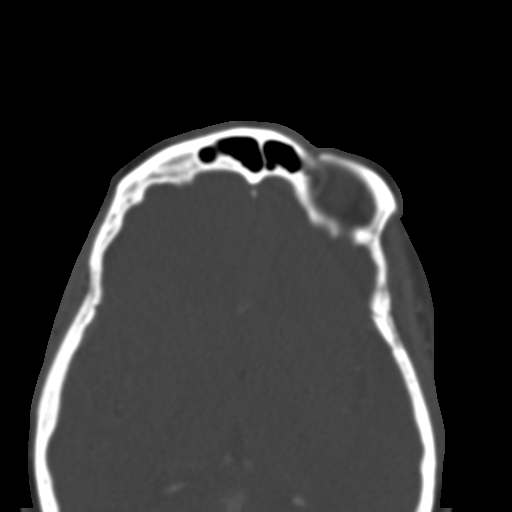
[im 34/41  brain]
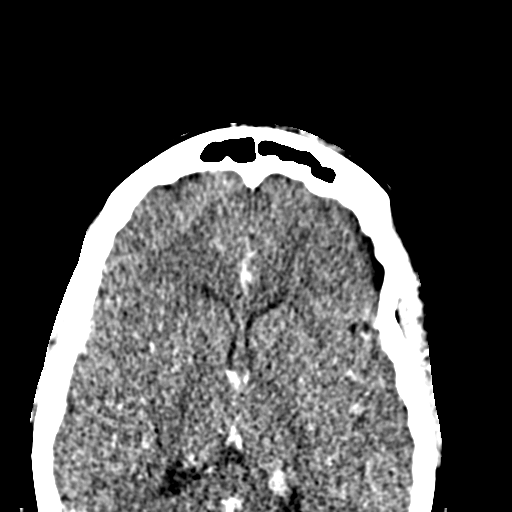
[im 34/41  bone]
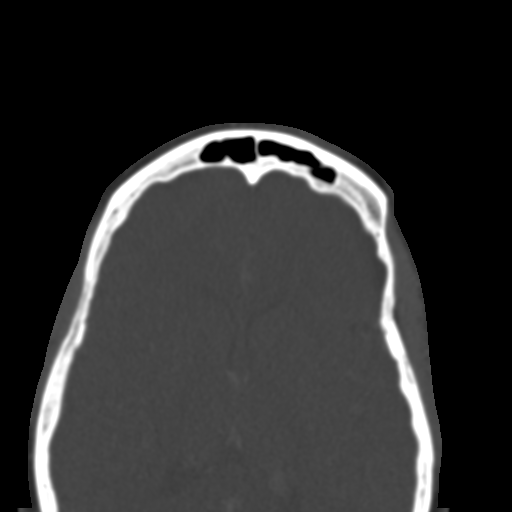
[im 36/41  bone]
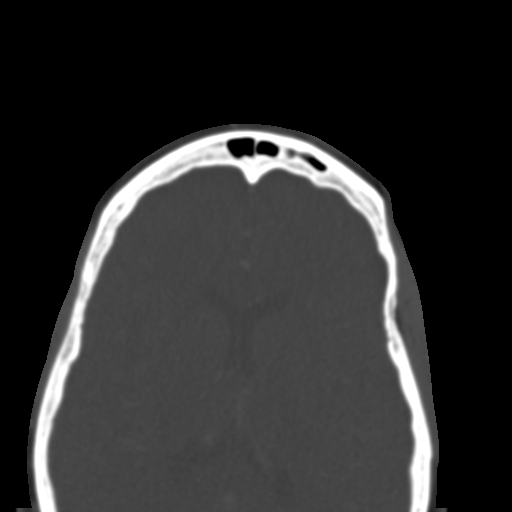
[im 39/41  bone]
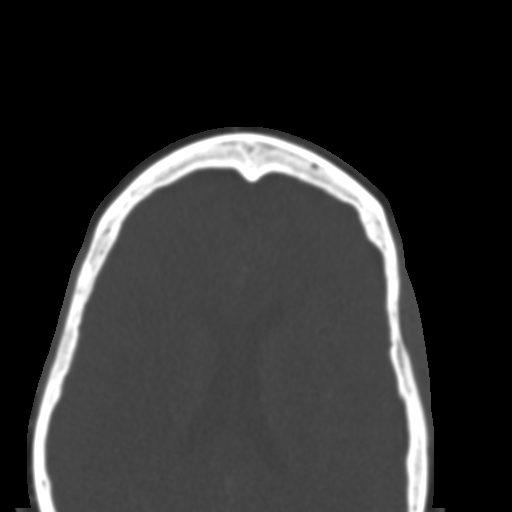

[15 of 30 positions shown; findings below may reference images not displayed]

FINDINGS: The globes and extraocular muscles appear symmetrical and intact. No
abnormal soft tissue swelling or contrast enhancement. No enhancing
mass lesion is appreciated. Minimal mucosal thickening in the
maxillary antra. Visualized paranasal sinuses are otherwise clear.
No acute air-fluid levels are demonstrated. Visualized mastoid air
cells are clear. Temporal bone structures appear intact and
symmetrical. No orbital fracture or displacement is demonstrated.
There is been previous resection or resorption of the inferior nasal
septum and inferior nasal turbinates bilaterally. Mild degenerative
changes in the temporomandibular joints.
IMPRESSION: No acute intra rolled process demonstrated. Mild chronic
inflammatory changes in the paranasal sinuses with postoperative
change in the nodes.

## 2017-07-26 ENCOUNTER — Encounter (HOSPITAL_COMMUNITY): Payer: Self-pay

## 2020-08-29 ENCOUNTER — Other Ambulatory Visit: Payer: Self-pay

## 2020-08-29 ENCOUNTER — Ambulatory Visit (INDEPENDENT_AMBULATORY_CARE_PROVIDER_SITE_OTHER): Payer: Medicare (Managed Care) | Admitting: Cardiology

## 2020-08-29 ENCOUNTER — Encounter: Payer: Self-pay | Admitting: Cardiology

## 2020-08-29 VITALS — BP 128/88 | HR 92 | Ht 67.0 in | Wt 213.2 lb

## 2020-08-29 DIAGNOSIS — R600 Localized edema: Secondary | ICD-10-CM

## 2020-08-29 DIAGNOSIS — I10 Essential (primary) hypertension: Secondary | ICD-10-CM

## 2020-08-29 DIAGNOSIS — I2699 Other pulmonary embolism without acute cor pulmonale: Secondary | ICD-10-CM | POA: Diagnosis not present

## 2020-08-29 NOTE — Patient Instructions (Signed)
Medication Instructions:  Your physician recommends that you continue on your current medications as directed. Please refer to the Current Medication list given to you today.  Testing/Procedures: Your physician has requested that you have an echocardiogram. Echocardiography is a painless test that uses sound waves to create images of your heart. It provides your doctor with information about the size and shape of your heart and how well your heart's chambers and valves are working. This procedure takes approximately one hour. There are no restrictions for this procedure. This will be done at our [address] location:  [address]  Your physician has requested that you have a lower extremity venous duplex. This test is an ultrasound of the veins in the legs. It looks at venous blood flow that carries blood from the heart to the legs or arms. Allow one hour for a Lower Venous exam. There are no restrictions or special instructions.  Follow-Up: At [facility], you and your health needs are our priority.  As part of our continuing mission to provide you with exceptional heart care, we have created designated Provider Care Teams.  These Care Teams include your primary Cardiologist (physician) and Advanced Practice Providers (APPs -  Physician Assistants and Nurse Practitioners) who all work together to provide you with the care you need, when you need it.  We recommend signing up for the patient portal called "MyChart".  Sign up information is provided on this After Visit Summary.  MyChart is used to connect with patients for Virtual Visits (Telemedicine).  Patients are able to view lab/test results, encounter notes, upcoming appointments, etc.  Non-urgent messages can be sent to your provider as well.   To learn more about what you can do with MyChart, go to [email or web address].    Your next appointment:   3 month(s)  The format for your next appointment:   In  Person  Provider:   Christopher Schumann, MD     

## 2020-08-29 NOTE — Progress Notes (Signed)
Cardiology Office Note:    Date:  08/29/2020   ID:  Sandra Sanford, DOB 1960/11/30, MRN [id number]  PCP:  April Manson, NP  Cardiologist:  No primary care provider on file.  Electrophysiologist:  None   Referring MD: April Manson, NP   Chief Complaint  Patient presents with  . Follow-up    FOLLOW-UP CHEST PAIN, SHORTNESS OF BREATH, SWELLING IN LEGS/ANKLES/FEET    History of Present Illness:    Sandra Sanford is a 59 y.o. female with a hx of hypertension, recently diagnosed pulmonary embolism who was referred by Juliann Mule, NP for evaluation of pulmonary embolism.   She presented to the ED 08/22/2020 with right-sided chest pain.  CTA showed tiny right-sided pulmonary embolism.  She was discharged on Xarelto.  She had right TKR in July.  RLE duplex 12/29 showed no DVT.  Taking xarelto, denies no bleeding issues.  She continues to have chest pain with deep inspiration.  Reports BP has been controlled, in  120s over 70s to 80s when checks at home.  She was recently diagnosed with severe OSA, starting CPAP.  Reports she has not been exercising.  No smoking history.  No history of heart disease in her immediate family.   Past Medical History:  Diagnosis Date  . Abnormal Pap smear    cryotherapy  . Hypertension     Past Surgical History:  Procedure Laterality Date  . skin graphs    . TUBAL LIGATION    . vein removed      Current Medications: Current Meds  Medication Sig  . allopurinol (ZYLOPRIM) 100 MG tablet Take 100 mg by mouth in the morning.  Marland Kitchen ALPRAZolam (XANAX) 0.5 MG tablet Take 0.5 mg by mouth at bedtime as needed.  . diclofenac Sodium (VOLTAREN) 1 % GEL Apply topically.  . dicyclomine (BENTYL) 20 MG tablet Take by mouth. PT TAKES AS NEEDED  . doxycycline (VIBRA-TABS) 100 MG tablet Take 100 mg by mouth 2 (two) times daily.  . DULoxetine HCl 40 MG CPEP Take 40 mg by mouth 2 (two) times daily.  . ergocalciferol (VITAMIN D2) 1.25 MG (50000 UT) capsule Take by  mouth.  . gabapentin (NEURONTIN) 300 MG capsule Take 300 mg by mouth in the morning, at noon, and at bedtime.  Marland Kitchen ibuprofen (ADVIL,MOTRIN) 800 MG tablet Take 1 tablet (800 mg total) by mouth 3 (three) times daily.  Marland Kitchen lisinopril-hydrochlorothiazide (ZESTORETIC) 10-12.5 MG tablet Take 2 tablets by mouth daily.  Marland Kitchen nystatin (MYCOSTATIN/NYSTOP) powder Apply topically.  . rivaroxaban (XARELTO) 20 MG TABS tablet Take 20 mg by mouth.  Marland Kitchen RIVAROXABAN (XARELTO) VTE STARTER PACK (15 & 20 MG) Take by mouth.  Carlena Hurl STARTER PACK Take by mouth as directed.     Allergies:   Patient has no known allergies.   Social History   Socioeconomic History  . Marital status: Married    Spouse name: Not on file  . Number of children: Not on file  . Years of education: Not on file  . Highest education level: Not on file  Occupational History  . Not on file  Tobacco Use  . Smoking status: Never Smoker  . Smokeless tobacco: Never Used  Substance and Sexual Activity  . Alcohol use: No  . Drug use: No  . Sexual activity: Yes    Birth control/protection: Surgical  Other Topics Concern  . Not on file  Social History Narrative  . Not on file   Social Determinants of Health  Financial Resource Strain: Not on file  Food Insecurity: Not on file  Transportation Needs: Not on file  Physical Activity: Not on file  Stress: Not on file  Social Connections: Not on file     Family History: The patient's family history includes Breast cancer in her maternal grandmother; Diabetes in her brother and father; Heart disease in her father; Hypertension in her daughter.  ROS:   Please see the history of present illness.    All other systems reviewed and are negative.  EKGs/Labs/Other Studies Reviewed:    The following studies were reviewed today:  EKG:  EKG is ordered today.  The ekg ordered today demonstrates normal sinus rhythm, rate 92, no ST abnormalities, LVH  Recent Labs: No results found for requested  labs within last 8760 hours.  Recent Lipid Panel No results found for: CHOL, TRIG, HDL, CHOLHDL, VLDL, LDLCALC, LDLDIRECT  Physical Exam:    VS:  BP 128/88 (BP Location: Left Arm, Patient Position: Sitting)   Pulse 92   Ht 5\' 7"  (1.702 m)   Wt 213 lb 3.2 oz (96.7 kg)   SpO2 95%   BMI 33.39 kg/m     Wt Readings from Last 3 Encounters:  08/29/20 213 lb 3.2 oz (96.7 kg)  08/28/13 230 lb (104.3 kg)  03/22/13 230 lb (104.3 kg)     GEN: in no acute distress HEENT: Normal NECK: No JVD; No carotid bruits LYMPHATICS: No lymphadenopathy CARDIAC: RRR, no murmurs, rubs, gallops RESPIRATORY:  Clear to auscultation without rales, wheezing or rhonchi  ABDOMEN: Soft, non-tender, non-distended MUSCULOSKELETAL:  trace edema SKIN: Warm and dry NEUROLOGIC:  Alert and oriented x 3 PSYCHIATRIC:  Normal affect   ASSESSMENT:    1. Acute pulmonary embolism, unspecified pulmonary embolism type, unspecified whether acute cor pulmonale present (HCC)   2. Lower leg edema   3. Essential hypertension    PLAN:    Pulmonary embolism: Presented with right-sided chest pain, found to have small right-sided pulmonary embolism.  Started on Xarelto.  Will check echocardiogram to evaluate for right heart strain, do not suspect given small PE.  Underwent right extremity duplex to rule out DVT, which was negative, but left lower extremity was not checked.  Will check left lower extremity duplex.  Continue Xarelto.  Hypertension: Appears controlled.  Continue lisinopril-HCTZ 20-25 mg daily  OSA: Recently diagnosed with severe OSA, starting CPAP  RTC in 3 months   Medication Adjustments/Labs and Tests Ordered: Current medicines are reviewed at length with the patient today.  Concerns regarding medicines are outlined above.  Orders Placed This Encounter  Procedures  . EKG 12-Lead  . ECHOCARDIOGRAM COMPLETE  . VAS 03/24/13 LOWER EXTREMITY VENOUS (DVT)   No orders of the defined types were placed in this  encounter.   Patient Instructions  Medication Instructions:  Your physician recommends that you continue on your current medications as directed. Please refer to the Current Medication list given to you today.  Testing/Procedures: Your physician has requested that you have an echocardiogram. Echocardiography is a painless test that uses sound waves to create images of your heart. It provides your doctor with information about the size and shape of your heart and how well your heart's chambers and valves are working. This procedure takes approximately one hour. There are no restrictions for this procedure.  This will be done at our [facility] location:  [address]  Your physician has requested that you have a lower extremity venous duplex. This  test is an ultrasound of the veins in the legs. It looks at venous blood flow that carries blood from the heart to the legs or arms. Allow one hour for a Lower Venous exam.There are no restrictions or special instructions.   Follow-Up: At [facility], you and your health needs are our priority.  As part of our continuing mission to provide you with exceptional heart care, we have created designated Provider Care Teams.  These Care Teams include your primary Cardiologist (physician) and Advanced Practice Providers (APPs -  Physician Assistants and Nurse Practitioners) who all work together to provide you with the care you need, when you need it.  We recommend signing up for the patient portal called "MyChart".  Sign up information is provided on this After Visit Summary.  MyChart is used to connect with patients for Virtual Visits (Telemedicine).  Patients are able to view lab/test results, encounter notes, upcoming appointments, etc.  Non-urgent messages can be sent to your provider as well.   To learn more about what you can do with MyChart, go to [email or web address].    Your next appointment:   3 month(s)  The format for  your next appointment:   In Person  Provider:   Epifanio Lesches, MD        Signed, Little Ishikawa, MD  08/29/2020 1:07 PM    [facility]

## 2020-09-02 ENCOUNTER — Ambulatory Visit (HOSPITAL_COMMUNITY)
Admission: RE | Admit: 2020-09-02 | Discharge: 2020-09-02 | Disposition: A | Discharge status: Home / Self Care | Payer: Medicare Other | Source: Ambulatory Visit | Attending: Cardiology | Admitting: Cardiology

## 2020-09-02 ENCOUNTER — Other Ambulatory Visit: Payer: Self-pay

## 2020-09-02 DIAGNOSIS — R600 Localized edema: Secondary | ICD-10-CM | POA: Diagnosis not present

## 2020-09-03 ENCOUNTER — Ambulatory Visit (HOSPITAL_COMMUNITY)
Admission: RE | Admit: 2020-09-03 | Discharge: 2020-09-03 | Disposition: A | Discharge status: Home / Self Care | Payer: Medicare Other | Source: Ambulatory Visit | Attending: Cardiology | Admitting: Cardiology

## 2020-09-03 ENCOUNTER — Other Ambulatory Visit: Payer: Self-pay

## 2020-09-03 DIAGNOSIS — I10 Essential (primary) hypertension: Secondary | ICD-10-CM

## 2020-09-03 DIAGNOSIS — I2699 Other pulmonary embolism without acute cor pulmonale: Secondary | ICD-10-CM

## 2020-09-03 LAB — ECHOCARDIOGRAM COMPLETE
Area-P 1/2: 6.02 cm2
S' Lateral: 3 cm

## 2020-09-03 NOTE — Progress Notes (Signed)
*  PRELIMINARY RESULTS* Echocardiogram 2D Echocardiogram has been performed.  Stacey Drain 09/03/2020, 2:50 PM

## 2020-09-17 ENCOUNTER — Other Ambulatory Visit (HOSPITAL_COMMUNITY): Payer: Medicare (Managed Care)

## 2020-10-08 ENCOUNTER — Encounter: Payer: Self-pay | Admitting: Vascular Surgery

## 2020-10-08 ENCOUNTER — Ambulatory Visit (INDEPENDENT_AMBULATORY_CARE_PROVIDER_SITE_OTHER): Payer: Medicare Other | Admitting: Vascular Surgery

## 2020-10-08 ENCOUNTER — Other Ambulatory Visit: Payer: Self-pay

## 2020-10-08 VITALS — BP 108/48 | HR 89 | Temp 98.1°F | Resp 20 | Ht 67.0 in | Wt 218.0 lb

## 2020-10-08 DIAGNOSIS — I82412 Acute embolism and thrombosis of left femoral vein: Secondary | ICD-10-CM | POA: Diagnosis not present

## 2020-10-08 NOTE — Progress Notes (Signed)
ASSESSMENT & PLAN:  60 y.o. female with DVT/PE in December 2021. On Xarelto. I counseled her to continue anticoagulation for a minimum of 3 months. She can continue it as long as she can afford the medicine and tolerates anticoagulation. Continue compression, elevation to the legs. OK for exercise. Follow up PRN.  CHIEF COMPLAINT:   Follow up DVT.  HISTORY:  HISTORY OF PRESENT ILLNESS: Sandra Sanford is a 60 y.o. female referred to clinic for opinion regarding DVT/PE. She initially presented to [facility] ER 08/22/20 with right sided chest pain. A subsegmental PE was found on CTA. She was started on Xarelto. A RLE venous duplex was negative for DVT, but the left side was not studied. Dr. Bjorn Pippin ordered a LLE venous duplex 08/29/20 which showed a femoral and peroneal DVT. She presents to clinic today looking for an opinion on treatment.  Symptoms are much improved.  She has some occasional pain over the anterior aspect of her left ankle.  She has some spider veins across her ankles and feet bilaterally which are not bothersome to her.  Note she did suffer a traumatic injury to her left heel as a child which required complex skin grafting with donor site from the right medial thigh.  This has healed nicely.  Past Medical History:  Diagnosis Date  . Abnormal Pap smear    cryotherapy  . DVT (deep venous thrombosis) (HCC)   . Hypertension   . PE (pulmonary thromboembolism) (HCC)     Past Surgical History:  Procedure Laterality Date  . skin graphs    . TUBAL LIGATION    . vein removed      Family History  Problem Relation Age of Onset  . Breast cancer Maternal Grandmother   . Diabetes Father   . Heart disease Father   . Diabetes Brother   . Hypertension Daughter     Social History   Socioeconomic History  . Marital status: Married    Spouse name: Not on file  . Number of children: Not on file  . Years of education: Not on file  . Highest education level: Not on  file  Occupational History  . Not on file  Tobacco Use  . Smoking status: Never Smoker  . Smokeless tobacco: Never Used  Vaping Use  . Vaping Use: Never used  Substance and Sexual Activity  . Alcohol use: No  . Drug use: No  . Sexual activity: Yes    Birth control/protection: Surgical  Other Topics Concern  . Not on file  Social History Narrative  . Not on file   Social Determinants of Health   Financial Resource Strain: Not on file  Food Insecurity: Not on file  Transportation Needs: Not on file  Physical Activity: Not on file  Stress: Not on file  Social Connections: Not on file  Intimate Partner Violence: Not on file    No Known Allergies  Current Outpatient Medications  Medication Sig Dispense Refill  . allopurinol (ZYLOPRIM) 100 MG tablet Take 100 mg by mouth in the morning.    Marland Kitchen ALPRAZolam (XANAX) 0.5 MG tablet Take 0.5 mg by mouth at bedtime as needed.    . diclofenac Sodium (VOLTAREN) 1 % GEL Apply topically.    . dicyclomine (BENTYL) 20 MG tablet Take by mouth. PT TAKES AS NEEDED    . DULoxetine HCl 40 MG CPEP Take 40 mg by mouth 2 (two) times daily.    . ergocalciferol (VITAMIN D2) 1.25 MG (50000 UT)  capsule Take by mouth.    . gabapentin (NEURONTIN) 300 MG capsule Take 300 mg by mouth in the morning, at noon, and at bedtime.    Marland Kitchen ibuprofen (ADVIL,MOTRIN) 800 MG tablet Take 1 tablet (800 mg total) by mouth 3 (three) times daily. 21 tablet 0  . lisinopril-hydrochlorothiazide (ZESTORETIC) 10-12.5 MG tablet Take 2 tablets by mouth daily.    Marland Kitchen nystatin (MYCOSTATIN/NYSTOP) powder Apply topically.    . rivaroxaban (XARELTO) 20 MG TABS tablet Take 20 mg by mouth.     No current facility-administered medications for this visit.    REVIEW OF SYSTEMS:  [X]  denotes positive finding, [ ]  denotes negative finding Cardiac  Comments:  Chest pain or chest pressure:    Shortness of breath upon exertion:    Short of breath when lying flat:    Irregular heart rhythm:         Vascular    Pain in calf, thigh, or hip brought on by ambulation:    Pain in feet at night that wakes you up from your sleep:     Blood clot in your veins:    Leg swelling:         Pulmonary    Oxygen at home:    Productive cough:     Wheezing:         Neurologic    Sudden weakness in arms or legs:     Sudden numbness in arms or legs:     Sudden onset of difficulty speaking or slurred speech:    Temporary loss of vision in one eye:     Problems with dizziness:         Gastrointestinal    Blood in stool:     Vomited blood:         Genitourinary    Burning when urinating:     Blood in urine:        Psychiatric    Major depression:         Hematologic    Bleeding problems:    Problems with blood clotting too easily:        Skin    Rashes or ulcers:        Constitutional    Fever or chills:     PHYSICAL EXAM:   Vitals:   10/08/20 0844  BP: (!) 108/48  Pulse: 89  Resp: 20  Temp: 98.1 F (36.7 C)  SpO2: 93%  Weight: 218 lb (98.9 kg)  Height: 5\' 7"  (1.702 m)   Constitutional: well appearing in no distress. Appears well nourished.  Neurologic: CN intact. No focal findings. No sensory loss. Psychiatric: Mood and affect symmetric and appropriate. Eyes: No icterus. No conjunctival pallor. Ears, nose, throat: mucous membranes moist. Midline trachea.  Cardiac: regular rate and rhythm.  Respiratory: unlabored. Abdominal: soft, non-tender, non-distended.  Peripheral vascular:  2+ Dps bilaterally  Scar over R medial thigh (graft donor site) and R knee (TKR)  L heel scarred (graft recipient)  Spider veins across ankles Extremity: No edema. No cyanosis. No pallor.  Skin: No gangrene. No ulceration.  Lymphatic: No Stemmer's sign. No palpable lymphadenopathy.   DATA REVIEW:    Most recent CBC CBC Latest Ref Rng & Units 08/29/2013 08/29/2013 03/22/2013  WBC 4.0 - 10.5 K/uL - 9.6 8.3  Hemoglobin 12.0 - 15.0 g/dL 15.3(H) 14.8 14.3  Hematocrit 36.0 - 46.0 % 45.0  43.8 42.4  Platelets 150 - 400 K/uL - 274 258     Most recent CMP  CMP Latest Ref Rng & Units 08/29/2013 03/22/2013 08/05/2012  Glucose 70 - 99 mg/dL 101(B) 510(C) 585(I)  BUN 6 - 23 mg/dL 12 9 13   Creatinine 0.50 - 1.10 mg/dL 7.78 2.42  Sodium 137 - 147 mEq/L 142 139 136  Potassium 3.7 - 5.3 mEq/L 3.2(L) 3.6 3.9  Chloride 96 - 112 mEq/L 101 102 98  CO2 19 - 32 mEq/L - 27 29  Calcium 8.4 - 10.5 mg/dL - 9.8 3.53    Renal function CrCl cannot be calculated (Patient's most recent lab result is older than the maximum 21 days allowed.).  No results found for: HGBA1C  No results found for: LDLCALC, LDLC, HIRISKLDL, POCLDL, LDLDIRECT, REALLDLC, TOTLDLC    Trevell Pariseau N. 61.4, MD [facility] Phone Number: [phone] 10/08/2020 9:44 AM

## 2020-10-25 ENCOUNTER — Other Ambulatory Visit: Payer: Self-pay | Admitting: *Deleted

## 2020-10-25 MED ORDER — RIVAROXABAN 20 MG PO TABS: 20.0000 mg | ORAL_TABLET | Freq: Every day | ORAL | 0 refills | Status: DC

## 2020-11-25 NOTE — Progress Notes (Deleted)
Cardiology Office Note:    Date:  11/25/2020   ID:  Sandra Sanford, DOB 04/05/1961, MRN [id number]  PCP:  April Manson, NP  Cardiologist:  No primary care provider on file.  Electrophysiologist:  None   Referring MD: April Manson, NP   No chief complaint on file.   History of Present Illness:    Sandra Sanford is a 60 y.o. female with a hx of hypertension, pulmonary embolism who presents for follow-up.  She was referred by Juliann Mule, NP for evaluation of pulmonary embolism, initially seen on 08/29/2020.  She presented to the ED 08/22/2020 with right-sided chest pain.  CTA showed tiny right-sided pulmonary embolism.  She was discharged on Xarelto.  She had right TKR in July.  RLE duplex 12/29 showed no DVT.  Taking xarelto, denies no bleeding issues.  She continues to have chest pain with deep inspiration.  Reports BP has been controlled, in  120s over 70s to 80s when checks at home.  She was recently diagnosed with severe OSA, starting CPAP.  Reports she has not been exercising.  No smoking history.  No history of heart disease in her immediate family.  Left lower extremity duplex showed acute DVT in left femoral vein and left peroneal veins.  Echocardiogram on 09/03/2020 showed LVEF 55 to 60%, normal RV function, normal RV size, no significant valvular disease.  Since last clinic visit, Reports aching pain in legs.  Walking to [employer] short of breath and CP    Past Medical History:  Diagnosis Date  . Abnormal Pap smear    cryotherapy  . DVT (deep venous thrombosis) (HCC)   . Hypertension   . PE (pulmonary thromboembolism) (HCC)     Past Surgical History:  Procedure Laterality Date  . skin graphs    . TUBAL LIGATION    . vein removed      Current Medications: No outpatient medications have been marked as taking for the 11/28/20 encounter (Appointment) with Little Ishikawa, MD.     Allergies:   Patient has no known allergies.   Social History    Socioeconomic History  . Marital status: Married    Spouse name: Not on file  . Number of children: Not on file  . Years of education: Not on file  . Highest education level: Not on file  Occupational History  . Not on file  Tobacco Use  . Smoking status: Never Smoker  . Smokeless tobacco: Never Used  Vaping Use  . Vaping Use: Never used  Substance and Sexual Activity  . Alcohol use: No  . Drug use: No  . Sexual activity: Yes    Birth control/protection: Surgical  Other Topics Concern  . Not on file  Social History Narrative  . Not on file   Social Determinants of Health   Financial Resource Strain: Not on file  Food Insecurity: Not on file  Transportation Needs: Not on file  Physical Activity: Not on file  Stress: Not on file  Social Connections: Not on file     Family History: The patient's family history includes Breast cancer in her maternal grandmother; Diabetes in her brother and father; Heart disease in her father; Hypertension in her daughter.  ROS:   Please see the history of present illness.    All other systems reviewed and are negative.  EKGs/Labs/Other Studies Reviewed:    The following studies were reviewed today:  EKG:  EKG is ordered today.  The ekg ordered today demonstrates  normal sinus rhythm, rate 92, no ST abnormalities, LVH  Recent Labs: No results found for requested labs within last 8760 hours.  Recent Lipid Panel No results found for: CHOL, TRIG, HDL, CHOLHDL, VLDL, LDLCALC, LDLDIRECT  Physical Exam:    VS:  There were no vitals taken for this visit.    Wt Readings from Last 3 Encounters:  10/08/20 218 lb (98.9 kg)  08/29/20 213 lb 3.2 oz (96.7 kg)  08/28/13 230 lb (104.3 kg)     GEN: in no acute distress HEENT: Normal NECK: No JVD; No carotid bruits LYMPHATICS: No lymphadenopathy CARDIAC: RRR, no murmurs, rubs, gallops RESPIRATORY:  Clear to auscultation without rales, wheezing or rhonchi  ABDOMEN: Soft, non-tender,  non-distended MUSCULOSKELETAL:  trace edema SKIN: Warm and dry NEUROLOGIC:  Alert and oriented x 3 PSYCHIATRIC:  Normal affect   ASSESSMENT:    No diagnosis found. PLAN:    Pulmonary embolism: Presented with right-sided chest pain, found to have small right-sided pulmonary embolism.  Started on Xarelto.   LE duplex showed acute DVT in left femoral vein and left peroneal veins.  Echocardiogram on 09/03/2020 showed LVEF 55 to 60%, normal RV function, normal RV size, no significant valvular disease.  Continue Xarelto.  Hypertension: Appears controlled.  Continue lisinopril-HCTZ 20-25 mg daily  OSA: Recently diagnosed with severe OSA, starting CPAP  RTC in ***   Medication Adjustments/Labs and Tests Ordered: Current medicines are reviewed at length with the patient today.  Concerns regarding medicines are outlined above.  No orders of the defined types were placed in this encounter.  No orders of the defined types were placed in this encounter.   There are no Patient Instructions on file for this visit.   Signed, Little Ishikawa, MD  11/25/2020 10:34 PM    [facility]

## 2020-11-26 ENCOUNTER — Ambulatory Visit: Payer: Medicare (Managed Care) | Admitting: Cardiology

## 2020-11-28 ENCOUNTER — Encounter: Payer: Self-pay | Admitting: Cardiology

## 2020-11-28 ENCOUNTER — Other Ambulatory Visit: Payer: Self-pay

## 2020-11-28 ENCOUNTER — Ambulatory Visit (INDEPENDENT_AMBULATORY_CARE_PROVIDER_SITE_OTHER): Payer: Medicare Other | Admitting: Cardiology

## 2020-11-28 VITALS — BP 114/68 | HR 96 | Ht 67.0 in | Wt 215.4 lb

## 2020-11-28 DIAGNOSIS — R072 Precordial pain: Secondary | ICD-10-CM | POA: Diagnosis not present

## 2020-11-28 DIAGNOSIS — I10 Essential (primary) hypertension: Secondary | ICD-10-CM

## 2020-11-28 DIAGNOSIS — I2699 Other pulmonary embolism without acute cor pulmonale: Secondary | ICD-10-CM

## 2020-11-28 NOTE — Progress Notes (Signed)
Cardiology Office Note:    Date:  11/28/2020   ID:  Sandra Sanford, DOB 22-Oct-1960, MRN [id number]  PCP:  April Manson, NP  Cardiologist:  No primary care provider on file.  Electrophysiologist:  None   Referring MD: April Manson, NP   Chief Complaint  Patient presents with  . Chest Pain    History of Present Illness:    Sandra Sanford is a 59 y.o. female with a hx of hypertension, pulmonary embolism who presents for follow-up.  She was referred by Juliann Mule, NP for evaluation of pulmonary embolism, initially seen on 08/29/2020.  She presented to the ED 08/22/2020 with right-sided chest pain.  CTA showed tiny right-sided pulmonary embolism.  She was discharged on Xarelto.  She had right TKR in July.  RLE duplex 12/29 showed no DVT.  Taking xarelto, denies no bleeding issues.  She continues to have chest pain with deep inspiration.  Reports BP has been controlled, in  120s over 70s to 80s when checks at home.  She was recently diagnosed with severe OSA, starting CPAP.  Reports she has not been exercising.  No smoking history.  No history of heart disease in her immediate family.  Left lower extremity duplex showed acute DVT in left femoral vein and left peroneal veins.  Echocardiogram on 09/03/2020 showed LVEF 55 to 60%, normal RV function, normal RV size, no significant valvular disease.  She is accompanied by her daughter. Since last clinic visit, she states having chest pain that has progressively worsened. She has been having right sided chest tightness upon exertion while walking to the mailbox. She describes the pain as a "crampy" feeling her chest during these episodes. She notes that it lasts for 15 minutes but resolves with rest. She relates her pain to her weight however she is concerned that it may be due to something much worse. She also has swelling and pain in her LE. She has no palpitations or lightheadedness. She states that she is walking more, but the LE edema has  impeded her from exercising. She does not exercise formally.   She also complains of having cough fits that makes her feel like she is choking at night. She reports that she is waiting for her CPAP machine to treat these symptoms.    Past Medical History:  Diagnosis Date  . Abnormal Pap smear    cryotherapy  . DVT (deep venous thrombosis) (HCC)   . Hypertension   . PE (pulmonary thromboembolism) (HCC)     Past Surgical History:  Procedure Laterality Date  . skin graphs    . TUBAL LIGATION    . vein removed      Current Medications: Current Meds  Medication Sig  . allopurinol (ZYLOPRIM) 100 MG tablet Take 100 mg by mouth in the morning.  Marland Kitchen ALPRAZolam (XANAX) 0.5 MG tablet Take 0.5 mg by mouth at bedtime as needed.  . diclofenac Sodium (VOLTAREN) 1 % GEL Apply topically.  . dicyclomine (BENTYL) 20 MG tablet Take by mouth. PT TAKES AS NEEDED  . DULoxetine HCl 40 MG CPEP Take 40 mg by mouth 2 (two) times daily.  . ergocalciferol (VITAMIN D2) 1.25 MG (50000 UT) capsule Take by mouth.  . gabapentin (NEURONTIN) 300 MG capsule Take 300 mg by mouth in the morning, at noon, and at bedtime.  Marland Kitchen lisinopril-hydrochlorothiazide (ZESTORETIC) 10-12.5 MG tablet Take 2 tablets by mouth daily.  Marland Kitchen nystatin (MYCOSTATIN/NYSTOP) powder Apply topically.  . rivaroxaban (XARELTO) 20 MG TABS tablet Take  1 tablet (20 mg total) by mouth daily with supper.  . [DISCONTINUED] ibuprofen (ADVIL,MOTRIN) 800 MG tablet Take 1 tablet (800 mg total) by mouth 3 (three) times daily.     Allergies:   Patient has no known allergies.   Social History   Socioeconomic History  . Marital status: Married    Spouse name: Not on file  . Number of children: Not on file  . Years of education: Not on file  . Highest education level: Not on file  Occupational History  . Not on file  Tobacco Use  . Smoking status: Never Smoker  . Smokeless tobacco: Never Used  Vaping Use  . Vaping Use: Never used  Substance and Sexual  Activity  . Alcohol use: No  . Drug use: No  . Sexual activity: Yes    Birth control/protection: Surgical  Other Topics Concern  . Not on file  Social History Narrative  . Not on file   Social Determinants of Health   Financial Resource Strain: Not on file  Food Insecurity: Not on file  Transportation Needs: Not on file  Physical Activity: Not on file  Stress: Not on file  Social Connections: Not on file     Family History: The patient's family history includes Breast cancer in her maternal grandmother; Diabetes in her brother and father; Heart disease in her father; Hypertension in her daughter.  ROS:   Please see the history of present illness.    (+)LE edema (+)"Crampy" Chest pain(R>L) All other systems reviewed and are negative.  EKGs/Labs/Other Studies Reviewed:    The following studies were reviewed today:  EKG:   3/22-Normal sinus rhythm. Rate:98. Poor R wave progression,  QTC 503, Non-specific T-wave flattening.  12/22-Normal sinus rhythm, rate 92, no ST abnormalities, LVH  Recent Labs: No results found for requested labs within last 8760 hours.  Recent Lipid Panel No results found for: CHOL, TRIG, HDL, CHOLHDL, VLDL, LDLCALC, LDLDIRECT  Physical Exam:    VS:  BP 114/68   Pulse 96   Ht 5\' 7"  (1.702 m)   Wt 215 lb 6.4 oz (97.7 kg)   SpO2 95%   BMI 33.74 kg/m     Wt Readings from Last 3 Encounters:  11/28/20 215 lb 6.4 oz (97.7 kg)  10/08/20 218 lb (98.9 kg)  08/29/20 213 lb 3.2 oz (96.7 kg)     GEN: in no acute distress HEENT: Normal NECK: No JVD; No carotid bruits LYMPHATICS: No lymphadenopathy CARDIAC: RRR, no murmurs, rubs, gallops RESPIRATORY:  Clear to auscultation without rales, wheezing or rhonchi  ABDOMEN: Soft, non-tender, non-distended MUSCULOSKELETAL:  trace edema SKIN: Warm and dry NEUROLOGIC:  Alert and oriented x 3 PSYCHIATRIC:  Normal affect   ASSESSMENT:    1. Precordial pain   2. Essential hypertension   3. Pulmonary  embolism without acute cor pulmonale, unspecified chronicity, unspecified pulmonary embolism type (HCC)    PLAN:    Chest pain: Atypical in description but does occur with exertion.  Does have CAD risk factors (age, hypertension).  Will evaluate for ischemia with Lexiscan Myoview.  Will check calcium score to evaluate CAD burden.  Pulmonary embolism: Presented with right-sided chest pain, found to have small right-sided pulmonary embolism.  Started on Xarelto.   LE duplex showed acute DVT in left femoral vein and left peroneal veins.  Echocardiogram on 09/03/2020 showed LVEF 55 to 60%, normal RV function, normal RV size, no significant valvular disease.  Continue Xarelto.  Hypertension: Appears controlled.  Continue  lisinopril-HCTZ 20-25 mg daily  OSA: Recently diagnosed with severe OSA, starting CPAP  RTC in 3 months   Shared Decision Making/Informed Consent The risks [chest pain, shortness of breath, cardiac arrhythmias, dizziness, blood pressure fluctuations, myocardial infarction, stroke/transient ischemic attack, nausea, vomiting, allergic reaction, radiation exposure, metallic taste sensation and life-threatening complications (estimated to be 1 in 10,000)], benefits (risk stratification, diagnosing coronary artery disease, treatment guidance) and alternatives of a nuclear stress test were discussed in detail with Ms. Gorgas and she agrees to proceed.       Medication Adjustments/Labs and Tests Ordered: Current medicines are reviewed at length with the patient today.  Concerns regarding medicines are outlined above.  Orders Placed This Encounter  Procedures  . CT CARDIAC SCORING (SELF PAY ONLY)  . Cardiac Stress Test: Informed Consent Details: Physician/Practitioner Attestation; Transcribe to consent form and obtain patient signature  . MYOCARDIAL PERFUSION IMAGING  . EKG 12-Lead   No orders of the defined types were placed in this encounter.   Patient Instructions  Medication  Instructions:  The current medical regimen is effective;  continue present plan and medications as directed. Please refer to the Current Medication list given to you today.  *If you need a refill on your cardiac medications before your next appointment, please call your pharmacy*  Lab Work: NONE  Testing/Procedures: Your physician has requested that you have a CALCIUM SCORING .  Your physician has requested that you have a lexiscan myoview. A cardiac stress test is a cardiological test that measures the heart's ability to respond to external stress in a controlled clinical environment. The stress response is induced by intravenous pharmacological stimulation.   Follow-Up: Your next appointment:  3 month(s) In Person with Epifanio Lesches, MD   At [facility], you and your health needs are our priority.  As part of our continuing mission to provide you with exceptional heart care, we have created designated Provider Care Teams.  These Care Teams include your primary Cardiologist (physician) and Advanced Practice Providers (APPs -  Physician Assistants and Nurse Practitioners) who all work together to provide you with the care you need, when you need it.     I,Alexis Bryant,acting as a Neurosurgeon for Little Ishikawa, MD.,have documented all relevant documentation on the behalf of Little Ishikawa, MD,as directed by  Little Ishikawa, MD while in the presence of Little Ishikawa, MD.  Signed, Little Ishikawa, MD  11/28/2020 5:26 PM    [facility]

## 2020-11-28 NOTE — Patient Instructions (Signed)
Medication Instructions:  The current medical regimen is effective;  continue present plan and medications as directed. Please refer to the Current Medication list given to you today.  *If you need a refill on your cardiac medications before your next appointment, please call your pharmacy*  Lab Work: NONE  Testing/Procedures: Your physician has requested that you have a CALCIUM SCORING .  Your physician has requested that you have a lexiscan myoview. A cardiac stress test is a cardiological test that measures the heart's ability to respond to external stress in a controlled clinical environment. The stress response is induced by intravenous pharmacological stimulation.   Follow-Up: Your next appointment:  3 month(s) In Person with Epifanio Lesches, MD   At [facility], you and your health needs are our priority.  As part of our continuing mission to provide you with exceptional heart care, we have created designated Provider Care Teams.  These Care Teams include your primary Cardiologist (physician) and Advanced Practice Providers (APPs -  Physician Assistants and Nurse Practitioners) who all work together to provide you with the care you need, when you need it.

## 2020-11-29 ENCOUNTER — Encounter: Payer: Self-pay | Admitting: Cardiology

## 2020-12-03 ENCOUNTER — Telehealth (HOSPITAL_COMMUNITY): Payer: Self-pay

## 2020-12-03 ENCOUNTER — Other Ambulatory Visit: Payer: Self-pay | Admitting: *Deleted

## 2020-12-03 MED ORDER — RIVAROXABAN 20 MG PO TABS: 20.0000 mg | ORAL_TABLET | Freq: Every day | ORAL | 0 refills | Status: DC

## 2020-12-03 NOTE — Telephone Encounter (Signed)
Close encounter 

## 2020-12-05 ENCOUNTER — Ambulatory Visit (HOSPITAL_COMMUNITY)
Admission: RE | Admit: 2020-12-05 | Discharge: 2020-12-05 | Disposition: A | Discharge status: Home / Self Care | Payer: Medicare Other | Source: Ambulatory Visit | Attending: Internal Medicine | Admitting: Internal Medicine

## 2020-12-05 ENCOUNTER — Other Ambulatory Visit: Payer: Self-pay

## 2020-12-05 DIAGNOSIS — R072 Precordial pain: Secondary | ICD-10-CM

## 2020-12-05 MED ORDER — TECHNETIUM TC 99M TETROFOSMIN IV KIT
31.2000 | PACK | Freq: Once | INTRAVENOUS | Status: AC | PRN
  Administered 2020-12-05: 31.2 via INTRAVENOUS
  Filled 2020-12-05: qty 32

## 2020-12-05 MED ORDER — REGADENOSON 0.4 MG/5ML IV SOLN
0.4000 mg | Freq: Once | INTRAVENOUS | Status: AC
  Administered 2020-12-05: 0.4 mg via INTRAVENOUS

## 2020-12-06 ENCOUNTER — Ambulatory Visit (HOSPITAL_COMMUNITY)
Admission: RE | Admit: 2020-12-06 | Discharge: 2020-12-06 | Disposition: A | Discharge status: Home / Self Care | Payer: Medicare Other | Source: Ambulatory Visit | Attending: Cardiovascular Disease | Admitting: Cardiovascular Disease

## 2020-12-06 ENCOUNTER — Other Ambulatory Visit: Payer: Self-pay | Admitting: *Deleted

## 2020-12-06 LAB — MYOCARDIAL PERFUSION IMAGING
LV dias vol: 114 mL (ref 46–106)
LV sys vol: 60 mL
Peak HR: 114 {beats}/min
Rest HR: 91 {beats}/min
SDS: 1
SRS: 0
SSS: 1
TID: 0.97

## 2020-12-06 MED ORDER — RIVAROXABAN 20 MG PO TABS: 20.0000 mg | ORAL_TABLET | Freq: Every day | ORAL | 0 refills | Status: DC

## 2020-12-06 MED ORDER — TECHNETIUM TC 99M TETROFOSMIN IV KIT
30.6000 | PACK | Freq: Once | INTRAVENOUS | Status: AC | PRN
  Administered 2020-12-06: 30.6 via INTRAVENOUS

## 2020-12-13 ENCOUNTER — Other Ambulatory Visit: Payer: Self-pay

## 2020-12-13 ENCOUNTER — Ambulatory Visit (INDEPENDENT_AMBULATORY_CARE_PROVIDER_SITE_OTHER)
Admission: RE | Admit: 2020-12-13 | Discharge: 2020-12-13 | Disposition: A | Discharge status: Home / Self Care | Payer: Self-pay | Source: Ambulatory Visit | Attending: Cardiology | Admitting: Cardiology

## 2020-12-13 DIAGNOSIS — R072 Precordial pain: Secondary | ICD-10-CM

## 2021-01-02 ENCOUNTER — Other Ambulatory Visit: Payer: Medicare Other

## 2021-03-16 NOTE — Progress Notes (Deleted)
Cardiology Office Note:    Date:  03/16/2021   ID:  Sandra Sanford, DOB 07-01-1961, MRN [id number]  PCP:  April Manson, NP  Cardiologist:  None  Electrophysiologist:  None   Referring MD: April Manson, NP   No chief complaint on file.   History of Present Illness:    Sandra Sanford is a 60 y.o. female with a hx of hypertension, pulmonary embolism who presents for follow-up.  She was referred by Sandra Mule, NP for evaluation of pulmonary embolism, initially seen on 08/29/2020.  She presented to the ED 08/22/2020 with right-sided chest pain.  CTA showed tiny right-sided pulmonary embolism.  She was discharged on Xarelto.  She had right TKR in July.  RLE duplex 12/29 showed no DVT.  Taking xarelto, denies no bleeding issues.  She continues to have chest pain with deep inspiration.  Reports BP has been controlled, in  120s over 70s to 80s when checks at home.  She was recently diagnosed with severe OSA, starting CPAP.  Reports she has not been exercising.  No smoking history.  No history of heart disease in her immediate family.  Left lower extremity duplex showed acute DVT in left femoral vein and left peroneal veins.  Echocardiogram on 09/03/2020 showed LVEF 55 to 60%, normal RV function, normal RV size, no significant valvular disease.  Reported chest pain and underwent Lexiscan Myoview on 12/06/2020 which showed fixed anterior defect, EF 48%, no ischemia.  Calcium score on 12/13/2020 was 0.  Since last clinic visit,  She is accompanied by her daughter. Since last clinic visit, she states having chest pain that has progressively worsened. She has been having right sided chest tightness upon exertion while walking to the mailbox. She describes the pain as a "crampy" feeling her chest during these episodes. She notes that it lasts for 15 minutes but resolves with rest. She relates her pain to her weight however she is concerned that it may be due to something much worse. She also has  swelling and pain in her LE. She has no palpitations or lightheadedness. She states that she is walking more, but the LE edema has impeded her from exercising. She does not exercise formally.   She also complains of having cough fits that makes her feel like she is choking at night. She reports that she is waiting for her CPAP machine to treat these symptoms.    Past Medical History:  Diagnosis Date   Abnormal Pap smear    cryotherapy   DVT (deep venous thrombosis) (HCC)    Hypertension    PE (pulmonary thromboembolism) (HCC)     Past Surgical History:  Procedure Laterality Date   skin graphs     TUBAL LIGATION     vein removed      Current Medications: No outpatient medications have been marked as taking for the 03/20/21 encounter (Appointment) with Little Ishikawa, MD.     Allergies:   Patient has no known allergies.   Social History   Socioeconomic History   Marital status: Single    Spouse name: Not on file   Number of children: Not on file   Years of education: Not on file   Highest education level: Not on file  Occupational History   Not on file  Tobacco Use   Smoking status: Never   Smokeless tobacco: Never  Vaping Use   Vaping Use: Never used  Substance and Sexual Activity   Alcohol use: No   Drug  use: No   Sexual activity: Yes    Birth control/protection: Surgical  Other Topics Concern   Not on file  Social History Narrative   Not on file   Social Determinants of Health   Financial Resource Strain: Not on file  Food Insecurity: Not on file  Transportation Needs: Not on file  Physical Activity: Not on file  Stress: Not on file  Social Connections: Not on file     Family History: The patient's family history includes Breast cancer in her maternal grandmother; Diabetes in her brother and father; Heart disease in her father; Hypertension in her daughter.  ROS:   Please see the history of present illness.    (+)LE edema (+)"Crampy" Chest  pain(R>L) All other systems reviewed and are negative.  EKGs/Labs/Other Studies Reviewed:    The following studies were reviewed today:  EKG:   3/22-Normal sinus rhythm. Rate:98. Poor R wave progression,  QTC 503, Non-specific T-wave flattening.  12/22-Normal sinus rhythm, rate 92, no ST abnormalities, LVH  Recent Labs: No results found for requested labs within last 8760 hours.  Recent Lipid Panel No results found for: CHOL, TRIG, HDL, CHOLHDL, VLDL, LDLCALC, LDLDIRECT  Physical Exam:    VS:  There were no vitals taken for this visit.    Wt Readings from Last 3 Encounters:  12/05/20 215 lb (97.5 kg)  11/28/20 215 lb 6.4 oz (97.7 kg)  10/08/20 218 lb (98.9 kg)     GEN: in no acute distress HEENT: Normal NECK: No JVD; No carotid bruits LYMPHATICS: No lymphadenopathy CARDIAC: RRR, no murmurs, rubs, gallops RESPIRATORY:  Clear to auscultation without rales, wheezing or rhonchi  ABDOMEN: Soft, non-tender, non-distended MUSCULOSKELETAL:  trace edema SKIN: Warm and dry NEUROLOGIC:  Alert and oriented x 3 PSYCHIATRIC:  Normal affect   ASSESSMENT:    No diagnosis found.  PLAN:    Chest pain: Atypical in description but does occur with exertion.  Does have CAD risk factors (age, hypertension).  Lexiscan Myoview on 12/06/2020 showed fixed anterior defect, EF 48%, no ischemia.  Calcium score on 12/13/2020 was 0.  Pulmonary embolism: Presented with right-sided chest pain, found to have small right-sided pulmonary embolism.  Started on Xarelto.   LE duplex showed acute DVT in left femoral vein and left peroneal veins.  Echocardiogram on 09/03/2020 showed LVEF 55 to 60%, normal RV function, normal RV size, no significant valvular disease.  Continue Xarelto.  Hypertension: Appears controlled.  Continue lisinopril-HCTZ 20-25 mg daily  OSA: Recently diagnosed with severe OSA, starting CPAP  RTC in ***    Medication Adjustments/Labs and Tests Ordered: Current medicines are reviewed  at length with the patient today.  Concerns regarding medicines are outlined above.  No orders of the defined types were placed in this encounter.  No orders of the defined types were placed in this encounter.   There are no Patient Instructions on file for this visit.   I,Alexis Bryant,acting as a Neurosurgeon for Little Ishikawa, MD.,have documented all relevant documentation on the behalf of Little Ishikawa, MD,as directed by  Little Ishikawa, MD while in the presence of Little Ishikawa, MD.  Signed, Little Ishikawa, MD  03/16/2021 9:32 PM    [facility]

## 2021-03-19 NOTE — Progress Notes (Incomplete)
Cardiology Office Note:    Date:  03/19/2021   ID:  Sandra Sanford, DOB 1961-05-09, MRN [id number]  PCP:  April Manson, NP  Cardiologist:  None  Electrophysiologist:  None   Referring MD: April Manson, NP   No chief complaint on file.   History of Present Illness:    Sandra Sanford is a 60 y.o. female with a hx of hypertension, pulmonary embolism who presents for follow-up.  She was referred by Juliann Mule, NP for evaluation of pulmonary embolism, initially seen on 08/29/2020.  She presented to the ED 08/22/2020 with right-sided chest pain.  CTA showed tiny right-sided pulmonary embolism.  She was discharged on Xarelto.  She had right TKR in July.  RLE duplex 12/29 showed no DVT.  Taking xarelto, denies no bleeding issues.  She continues to have chest pain with deep inspiration.  Reports BP has been controlled, in  120s over 70s to 80s when checks at home.  She was recently diagnosed with severe OSA, starting CPAP.  Reports she has not been exercising.  No smoking history.  No history of heart disease in her immediate family.  Left lower extremity duplex showed acute DVT in left femoral vein and left peroneal veins.  Echocardiogram on 09/03/2020 showed LVEF 55 to 60%, normal RV function, normal RV size, no significant valvular disease.  Reported chest pain and underwent Lexiscan Myoview on 12/06/2020 which showed fixed anterior defect, EF 48%, no ischemia.  Calcium score on 12/13/2020 was 0.  Since last clinic visit, (12/21) She is accompanied by her daughter. Since last clinic visit, she states having chest pain that has progressively worsened. She has been having right sided chest tightness upon exertion while walking to the mailbox. She describes the pain as a "crampy" feeling her chest during these episodes. She notes that it lasts for 15 minutes but resolves with rest. She relates her pain to her weight however she is concerned that it may be due to something much worse. She also has  swelling and pain in her LE. She has no palpitations or lightheadedness. She states that she is walking more, but the LE edema has impeded her from exercising. She does not exercise formally.   She also complains of having cough fits that makes her feel like she is choking at night. She reports that she is waiting for her CPAP machine to treat these symptoms.    Since last clinic visit, she is feeling ***  Blood pressure:  Postives (has):  Denies:  Medications: Exercising: Dieting:   Fhx:  Shx:     Past Medical History:  Diagnosis Date   Abnormal Pap smear    cryotherapy   DVT (deep venous thrombosis) (HCC)    Hypertension    PE (pulmonary thromboembolism) (HCC)     Past Surgical History:  Procedure Laterality Date   skin graphs     TUBAL LIGATION     vein removed      Current Medications: No outpatient medications have been marked as taking for the 03/20/21 encounter (Appointment) with Little Ishikawa, MD.     Allergies:   Patient has no known allergies.   Social History   Socioeconomic History   Marital status: Single    Spouse name: Not on file   Number of children: Not on file   Years of education: Not on file   Highest education level: Not on file  Occupational History   Not on file  Tobacco Use   Smoking  status: Never   Smokeless tobacco: Never  Vaping Use   Vaping Use: Never used  Substance and Sexual Activity   Alcohol use: No   Drug use: No   Sexual activity: Yes    Birth control/protection: Surgical  Other Topics Concern   Not on file  Social History Narrative   Not on file   Social Determinants of Health   Financial Resource Strain: Not on file  Food Insecurity: Not on file  Transportation Needs: Not on file  Physical Activity: Not on file  Stress: Not on file  Social Connections: Not on file     Family History: The patient's family history includes Breast cancer in her maternal grandmother; Diabetes in her brother and  father; Heart disease in her father; Hypertension in her daughter.  ROS:   Please see the history of present illness.    (+)LE edema (+)"Crampy" Chest pain(R>L) All other systems reviewed and are negative.  EKGs/Labs/Other Studies Reviewed:    The following studies were reviewed today:  EKG:   3/22-Normal sinus rhythm. Rate:98. Poor R wave progression,  QTC 503, Non-specific T-wave flattening.  12/22-Normal sinus rhythm, rate 92, no ST abnormalities, LVH  Recent Labs: No results found for requested labs within last 8760 hours.  Recent Lipid Panel No results found for: CHOL, TRIG, HDL, CHOLHDL, VLDL, LDLCALC, LDLDIRECT  Physical Exam:    VS:  There were no vitals taken for this visit.    Wt Readings from Last 3 Encounters:  12/05/20 215 lb (97.5 kg)  11/28/20 215 lb 6.4 oz (97.7 kg)  10/08/20 218 lb (98.9 kg)     GEN: in no acute distress HEENT: Normal NECK: No JVD; No carotid bruits LYMPHATICS: No lymphadenopathy CARDIAC: RRR, no murmurs, rubs, gallops RESPIRATORY:  Clear to auscultation without rales, wheezing or rhonchi  ABDOMEN: Soft, non-tender, non-distended MUSCULOSKELETAL:  trace edema SKIN: Warm and dry NEUROLOGIC:  Alert and oriented x 3 PSYCHIATRIC:  Normal affect   ASSESSMENT:    No diagnosis found.  PLAN:    Chest pain: Atypical in description but does occur with exertion.  Does have CAD risk factors (age, hypertension).  Lexiscan Myoview on 12/06/2020 showed fixed anterior defect, EF 48%, no ischemia.  Calcium score on 12/13/2020 was 0.  Pulmonary embolism: Presented with right-sided chest pain, found to have small right-sided pulmonary embolism.  Started on Xarelto.   LE duplex showed acute DVT in left femoral vein and left peroneal veins.  Echocardiogram on 09/03/2020 showed LVEF 55 to 60%, normal RV function, normal RV size, no significant valvular disease.  Continue Xarelto.  Hypertension: Appears controlled.  Continue lisinopril-HCTZ 20-25 mg  daily  OSA: Recently diagnosed with severe OSA, starting CPAP  RTC in ***    Medication Adjustments/Labs and Tests Ordered: Current medicines are reviewed at length with the patient today.  Concerns regarding medicines are outlined above.  No orders of the defined types were placed in this encounter.  No orders of the defined types were placed in this encounter.   There are no Patient Instructions on file for this visit.   I,Alexis Bryant,acting as a Neurosurgeon for Little Ishikawa, MD.,have documented all relevant documentation on the behalf of Little Ishikawa, MD,as directed by  Little Ishikawa, MD while in the presence of Little Ishikawa, MD.  Signed, Scheryl Marten  03/19/2021 4:54 PM    [facility]

## 2021-03-20 ENCOUNTER — Ambulatory Visit: Payer: Medicare Other | Admitting: Cardiology

## 2021-08-03 NOTE — Progress Notes (Signed)
Virtual Visit via Video Note   This visit type was conducted due to national recommendations for restrictions regarding the COVID-19 Pandemic (e.g. social distancing) in an effort to limit this patient's exposure and mitigate transmission in our community.  Due to her co-morbid illnesses, this patient is at least at moderate risk for complications without adequate follow up.  This format is felt to be most appropriate for this patient at this time.  All issues noted in this document were discussed and addressed.  A limited physical exam was performed with this format.  Please refer to the patient's chart for her consent to telehealth for [facility].      Date:  08/06/2021   ID:  Sandra Sanford, DOB 1961-05-10, MRN [id number]  Patient Location: Home Provider Location: Office/Clinic  PCP:  April Manson, NP  Cardiologist:  None  Electrophysiologist:  None   Evaluation Performed:  Follow-Up Visit  Chief Complaint:  LE edema  History of Present Illness:    Sandra Sanford is a 60 y.o. female with a hx of hypertension, pulmonary embolism who presents for follow-up.  She was referred by Juliann Mule, NP for evaluation of pulmonary embolism, initially seen on 08/29/2020.  She presented to the ED 08/22/2020 with right-sided chest pain.  CTA showed tiny right-sided pulmonary embolism.  She was discharged on Xarelto.  She had right TKR in July.  RLE duplex 12/29 showed no DVT.  Taking xarelto, denies no bleeding issues.  She continues to have chest pain with deep inspiration.  Reports BP has been controlled, in  120s over 70s to 80s when checks at home.  She was recently diagnosed with severe OSA, starting CPAP.  Reports she has not been exercising.  No smoking history.  No history of heart disease in her immediate family.  Left lower extremity duplex showed acute DVT in left femoral vein and left peroneal veins.  Echocardiogram on 09/03/2020 showed LVEF 55 to 60%, normal RV function, normal RV  size, no significant valvular disease.  She reported chest pain and Lexiscan Myoview was done on 12/06/2020 which showed no ischemia, fixed anterior defect, EF 48%.  Calcium score and 12/13/2020 was 0.  Since last clinic visit, reports he has been having pain in both legs.  States that she feels the pain all the time.  Also having some swelling.  She has been compliant with Xarelto.  Denies any bleeding issues.  Reports chest pain has improved.  Does report persistent dyspnea with exertion.  Reports she has lost 25 pounds.  Reports she had to return her CPAP because she was not using.   BP Readings from Last 3 Encounters:  08/06/21 (!) 158/91  11/28/20 114/68  10/08/20 (!) 108/48      Past Medical History:  Diagnosis Date   Abnormal Pap smear    cryotherapy   DVT (deep venous thrombosis) (HCC)    Hypertension    PE (pulmonary thromboembolism) (HCC)    Past Surgical History:  Procedure Laterality Date   skin graphs     TUBAL LIGATION     vein removed       Current Meds  Medication Sig   allopurinol (ZYLOPRIM) 100 MG tablet Take 100 mg by mouth in the morning.   ALPRAZolam (XANAX) 0.5 MG tablet Take 0.5 mg by mouth at bedtime as needed.   diclofenac Sodium (VOLTAREN) 1 % GEL Apply topically.   dicyclomine (BENTYL) 20 MG tablet Take by mouth. PT TAKES AS NEEDED   ergocalciferol (  VITAMIN D2) 1.25 MG (50000 UT) capsule Take by mouth.   nystatin (MYCOSTATIN/NYSTOP) powder Apply topically.   [DISCONTINUED] lisinopril-hydrochlorothiazide (ZESTORETIC) 10-12.5 MG tablet Take 2 tablets by mouth daily.   [DISCONTINUED] rivaroxaban (XARELTO) 20 MG TABS tablet Take 1 tablet (20 mg total) by mouth daily with supper.     Allergies:   Patient has no known allergies.   Social History   Tobacco Use   Smoking status: Never   Smokeless tobacco: Never  Vaping Use   Vaping Use: Never used  Substance Use Topics   Alcohol use: No   Drug use: No     Family Hx: The patient's family history  includes Breast cancer in her maternal grandmother; Diabetes in her brother and father; Heart disease in her father; Hypertension in her daughter.  ROS:   Please see the history of present illness.     All other systems reviewed and are negative.   Prior CV studies:   The following studies were reviewed today:    Labs/Other Tests and Data Reviewed:    EKG:  No ECG reviewed.  Recent Labs: No results found for requested labs within last 8760 hours.   Recent Lipid Panel No results found for: CHOL, TRIG, HDL, CHOLHDL, LDLCALC, LDLDIRECT  Wt Readings from Last 3 Encounters:  08/06/21 204 lb (92.5 kg)  12/05/20 215 lb (97.5 kg)  11/28/20 215 lb 6.4 oz (97.7 kg)     Objective:    Vital Signs:  BP (!) 158/91   Pulse 100   Temp 98.4 F (36.9 C)   Ht 5\' 7"  (1.702 m)   Wt 204 lb (92.5 kg)   BMI 31.95 kg/m    VITAL SIGNS:  reviewed GEN:  no acute distress Resp: normal work of breathing   ASSESSMENT & PLAN:    Chest pain: Atypical in description but does occur with exertion.  Does have CAD risk factors (age, hypertension).  She reported chest pain and Lexiscan Myoview was done on 12/06/2020 which showed no ischemia, fixed anterior defect, EF 48%.  Calcium score and 12/13/2020 was 0. -Reports chest pain has improved, no further cardiac work-up recommended at this time   Pulmonary embolism: Presented with right-sided chest pain, found to have small right-sided pulmonary embolism.  Started on Xarelto.   LE duplex showed acute DVT in left femoral vein and left peroneal veins.  Echocardiogram on 09/03/2020 showed LVEF 55 to 60%, normal RV function, normal RV size, no significant valvular disease.  Continue Xarelto. -She is reporting worsening swelling in legs.  Recommend repeat lower extremity duplex.  She is currently staying in [place]01[place] and is establishing with PCP there, will have this done there  Hypertension: Continue lisinopril-HCTZ 20-25 mg daily.  Reports elevated BP when  checked today but has been controlled.  Recommend monitoring home BP and bring home BP monitor to calibrate when she establishes with new PCP in [place]    OSA: diagnosed with severe OSA, started CPAP but had to return because was not using.  Reports she is planning to establish with sleep medicine in [place].    RTC in 6 months   Time:   Today, I have spent 12 minutes with the patient with telehealth technology discussing the above problems.     Medication Adjustments/Labs and Tests Ordered: Current medicines are reviewed at length with the patient today.  Concerns regarding medicines are outlined above.   Tests Ordered: No orders of the defined types were placed in this encounter.  Medication Changes: Meds ordered this encounter  Medications   rivaroxaban (XARELTO) 20 MG TABS tablet    Sig: Take 1 tablet (20 mg total) by mouth daily with supper.    Dispense:  90 tablet    Refill:  0   lisinopril-hydrochlorothiazide (ZESTORETIC) 10-12.5 MG tablet    Sig: Take 2 tablets by mouth daily.    Dispense:  90 tablet    Refill:  1    Follow Up:  In Person in 6 month(s)  Signed, Little Ishikawa, MD  08/06/2021 5:52 PM    [facility]

## 2021-08-06 ENCOUNTER — Telehealth (INDEPENDENT_AMBULATORY_CARE_PROVIDER_SITE_OTHER): Payer: Medicare Other | Admitting: Cardiology

## 2021-08-06 ENCOUNTER — Other Ambulatory Visit: Payer: Self-pay

## 2021-08-06 ENCOUNTER — Encounter: Payer: Self-pay | Admitting: Cardiology

## 2021-08-06 VITALS — BP 158/91 | HR 100 | Temp 98.4°F | Ht 67.0 in | Wt 204.0 lb

## 2021-08-06 DIAGNOSIS — R072 Precordial pain: Secondary | ICD-10-CM | POA: Diagnosis not present

## 2021-08-06 DIAGNOSIS — I10 Essential (primary) hypertension: Secondary | ICD-10-CM

## 2021-08-06 DIAGNOSIS — R600 Localized edema: Secondary | ICD-10-CM

## 2021-08-06 DIAGNOSIS — I2699 Other pulmonary embolism without acute cor pulmonale: Secondary | ICD-10-CM

## 2021-08-06 DIAGNOSIS — G4733 Obstructive sleep apnea (adult) (pediatric): Secondary | ICD-10-CM

## 2021-08-06 MED ORDER — RIVAROXABAN 20 MG PO TABS: 20.0000 mg | ORAL_TABLET | Freq: Every day | ORAL | 0 refills | Status: DC

## 2021-08-06 MED ORDER — LISINOPRIL-HYDROCHLOROTHIAZIDE 10-12.5 MG PO TABS: 2.0000 | ORAL_TABLET | Freq: Every day | ORAL | 1 refills | Status: DC

## 2021-08-06 NOTE — Patient Instructions (Addendum)
Medication Instructions:  The current medical regimen is effective;  continue present plan and medications.  *If you need a refill on your cardiac medications before your next appointment, please call your pharmacy*  Follow-Up: At [facility], you and your health needs are our priority.  As part of our continuing mission to provide you with exceptional heart care, we have created designated Provider Care Teams.  These Care Teams include your primary Cardiologist (physician) and Advanced Practice Providers (APPs -  Physician Assistants and Nurse Practitioners) who all work together to provide you with the care you need, when you need it.  We recommend signing up for the patient portal called "MyChart".  Sign up information is provided on this After Visit Summary.  MyChart is used to connect with patients for Virtual Visits (Telemedicine).  Patients are able to view lab/test results, encounter notes, upcoming appointments, etc.  Non-urgent messages can be sent to your provider as well.   To learn more about what you can do with MyChart, go to [email or web address].    Your next appointment:   6 month(s)  The format for your next appointment:   In Person  Provider:   Dr.Schumann     Please see your primary care provider in [place]- to discuss having a scan of your legs.

## 2021-11-11 ENCOUNTER — Other Ambulatory Visit: Payer: Self-pay | Admitting: Cardiology

## 2021-11-11 NOTE — Telephone Encounter (Signed)
Prescription refill request for Xarelto received.  ? ?Hx:DVT, PE ?Last office visit: 08/06/2021, schumann ?Weight:92.5 kg ?Age: 61 yo  ?Scr: 0.73, 01/24/2021 ?CrCl: 120 ml/min  ? ?Refill sent.  ?

## 2021-11-20 ENCOUNTER — Other Ambulatory Visit: Payer: Self-pay | Admitting: Cardiology

## 2022-01-24 ENCOUNTER — Other Ambulatory Visit: Payer: Self-pay | Admitting: Cardiology

## 2022-01-27 NOTE — Telephone Encounter (Signed)
Prescription refill request for Xarelto received.  Indication:  PE Last office visit:12/7/2 Sandra Sanford)  Weight: 93.5kg Age: 61 Scr:  0.73 (01/24/21)  CrCl: 120.10ml/min  Pt labs overdue. Called to schedule lab appt. No answer and unable to leave voicemail.

## 2022-03-17 ENCOUNTER — Other Ambulatory Visit: Payer: Self-pay | Admitting: Cardiology

## 2022-03-17 DIAGNOSIS — I2699 Other pulmonary embolism without acute cor pulmonale: Secondary | ICD-10-CM

## 2022-03-17 NOTE — Telephone Encounter (Signed)
Prescription refill request for Xarelto received.  Indication:DVT Last office visit:12/22 Weight:92.5 kg Age:61 OIB:BCWUG Labs CrCl:Needs Labs  Prescription refilled

## 2022-05-01 ENCOUNTER — Other Ambulatory Visit: Payer: Self-pay | Admitting: Cardiology

## 2022-06-01 ENCOUNTER — Encounter: Payer: Self-pay | Admitting: Nurse Practitioner

## 2022-06-01 ENCOUNTER — Telehealth: Payer: Self-pay | Admitting: *Deleted

## 2022-06-01 ENCOUNTER — Ambulatory Visit: Payer: Medicare Other | Attending: Nurse Practitioner | Admitting: Nurse Practitioner

## 2022-06-01 ENCOUNTER — Telehealth: Payer: Self-pay

## 2022-06-01 DIAGNOSIS — Z01810 Encounter for preprocedural cardiovascular examination: Secondary | ICD-10-CM | POA: Diagnosis not present

## 2022-06-01 NOTE — Telephone Encounter (Signed)
   Pre-operative Risk Assessment    Patient Name: Sandra Sanford  DOB: 12/15/1960 MRN: [id number]    Request for Surgical Clearance    Procedure:  Dental Extraction - Amount of Teeth to be Pulled:  14  Date of Surgery:  Clearance 06/02/22                            Surgeon:  Durene Cal, DDS Surgeon's Group or Practice Name:  [facility] Phone number:  [phone] Fax number:  [phone]   Type of Clearance Requested:   - Medical  - Pharmacy:  Hold Rivaroxaban (Xarelto)     Type of Anesthesia:  Local    Additional requests/questions:      [name]   06/01/2022, 10:51 AM

## 2022-06-01 NOTE — Telephone Encounter (Signed)
Patient with diagnosis of PE/DVT on Xarelto for anticoagulation.    Patient had a PE 08/29/20- No DVT found at that time. Was diagnosed with DVT on doppler 09/03/20  Procedure: Dental Extraction - Amount of Teeth to be Pulled:  14 Date of procedure: 06/02/22  Patient has already been holding Xarelto since 9/28. Since procedure is tomorrow, patient should continue to hold and then resume as soon as safely possible after procedure.  **This guidance is not considered finalized until pre-operative APP has relayed final recommendations.**

## 2022-06-01 NOTE — Progress Notes (Signed)
Virtual Visit via Telephone Note   Because of Sandra Sanford's co-morbid illnesses, she is at least at moderate risk for complications without adequate follow up.  This format is felt to be most appropriate for this patient at this time.  The patient did not have access to video technology/had technical difficulties with video requiring transitioning to audio format only (telephone).  All issues noted in this document were discussed and addressed.  No physical exam could be performed with this format.  Please refer to the patient's chart for her consent to telehealth for Sandra Sanford.  Evaluation Performed:  Preoperative cardiovascular risk assessment _____________   Date:  06/01/2022   Patient ID:  Sandra Sanford, DOB [birth year], MRN [id number] Patient Location:  Home Provider location:   Office  Primary Care Provider:  April Manson, NP Primary Cardiologist:  Sandra Ishikawa, MD  Chief Complaint / Patient Profile   61 y.o. y/o female with a h/o hypertension, pulmonary embolism, OSA, chest pain with low risk NST 11/2020 and calcium score of 0 on 12/13/2020, leg swelling who is pending dental extraction x 14 teeth and presents today for telephonic preoperative cardiovascular risk assessment.  Past Medical History    Past Medical History:  Diagnosis Date   Abnormal Pap smear    cryotherapy   DVT (deep venous thrombosis) (HCC)    Hypertension    PE (pulmonary thromboembolism) (HCC)    Past Surgical History:  Procedure Laterality Date   skin graphs     TUBAL LIGATION     vein removed      Allergies  No Known Allergies  History of Present Illness    Sandra Sanford is a 61 y.o. female who presents via audio/video conferencing for a telehealth visit today.  Pt was last seen in cardiology clinic on 08/06/2021 by Dr. Bjorn Sanford.  At that time Sandra Sanford was doing well.  The patient is now pending procedure as outlined above. Since her last visit, she denies  chest pain, shortness of breath, lower extremity edema, fatigue, palpitations, melena, hematuria, hemoptysis, diaphoresis, weakness, presyncope, syncope, orthopnea, and PND.   Home Medications    Prior to Admission medications   Medication Sig Start Date End Date Taking? Authorizing Provider  allopurinol (ZYLOPRIM) 100 MG tablet Take 100 mg by mouth in the morning.    [provider]  diclofenac Sodium (VOLTAREN) 1 % GEL Apply topically.    [provider]  dicyclomine (BENTYL) 20 MG tablet Take by mouth. PT TAKES AS NEEDED    [provider]  DULoxetine HCl 40 MG CPEP Take 40 mg by mouth 2 (two) times daily. 05/13/20 06/01/22  [provider]  ergocalciferol (VITAMIN D2) 1.25 MG (50000 UT) capsule Take by mouth. 03/18/20   [provider]  gabapentin (NEURONTIN) 300 MG capsule Take 300 mg by mouth in the morning, at noon, and at bedtime. 03/18/20 06/01/22  [provider]  lisinopril-hydrochlorothiazide (ZESTORETIC) 10-12.5 MG tablet TAKE 2 TABLETS BY MOUTH DAILY 05/01/22 05/01/23  Sandra Ishikawa, MD  nystatin (MYCOSTATIN/NYSTOP) powder Apply topically.    [provider]  rivaroxaban (XARELTO) 20 MG TABS tablet TAKE 1 TABLET (20 MG TOTAL) BY MOUTH DAILY WITH SUPPER. NEEDS LABS FOR XARELTO REFILLS 03/17/22   Sandra Ishikawa, MD    Physical Exam    Vital Signs:  Sandra Sanford does not have vital signs available for review today.  Given telephonic nature of communication, physical exam is limited. AAOx3. NAD. Normal affect.  Speech and respirations are unlabored.  Accessory Clinical Findings    None  Assessment & Plan    1.  Preoperative Cardiovascular Risk Assessment: The patient is doing well from a cardiac perspective. Therefore, based on ACC/AHA guidelines, the patient would be at acceptable risk for the planned procedure without further cardiovascular testing. The patient was advised that if he develops new  symptoms prior to surgery to contact our office to arrange for a follow-up visit, and he verbalized understanding. According to the Revised Cardiac Risk Index (RCRI), her Perioperative Risk of Major Cardiac Event is (%): 0.4. Her Functional Capacity in METs is: 7.59 according to the Duke Activity Status Index (DASI).  The patient was advised that if she develops new symptoms prior to surgery to contact our office to arrange for a follow-up visit, and she verbalized understanding.  Patient has already been holding Xarelto since 9/28. Since procedure is tomorrow, patient should continue to hold and then resume as soon as safely possible after procedure. Advised her that in the future, to prevent stroke/VTE risk, she should only hold for the recommended time.   A copy of this note will be routed to requesting surgeon.  Time:   Today, I have spent 7 minutes with the patient with telehealth technology discussing medical history, symptoms, and management plan.     Sandra Life, NP-C  06/01/2022, 1:37 PM [address], [address] Office [phone] Fax [phone]

## 2022-06-01 NOTE — Telephone Encounter (Signed)
Pt has been scheduled for a tele visit, today, 06/01/22 1:40.  Consent on file / medications reconciled.

## 2022-06-01 NOTE — Telephone Encounter (Signed)
Pt has been scheduled for a tele visit, today, 06/01/22, 1:40.  Consent on file / medications reconciled.    Patient Consent for Virtual Visit        Sandra Sanford has provided verbal consent on 06/01/2022 for a virtual visit (video or telephone).   CONSENT FOR VIRTUAL VISIT FOR:  Sandra Sanford  By participating in this virtual visit I agree to the following:  I hereby voluntarily request, consent and authorize [facility] and its employed or contracted physicians, physician assistants, nurse practitioners or other licensed health care professionals (the Practitioner), to provide me with telemedicine health care services (the "Services") as deemed necessary by the treating Practitioner. I acknowledge and consent to receive the Services by the Practitioner via telemedicine. I understand that the telemedicine visit will involve communicating with the Practitioner through live audiovisual communication technology and the disclosure of certain medical information by electronic transmission. I acknowledge that I have been given the opportunity to request an in-person assessment or other available alternative prior to the telemedicine visit and am voluntarily participating in the telemedicine visit.  I understand that I have the right to withhold or withdraw my consent to the use of telemedicine in the course of my care at any time, without affecting my right to future care or treatment, and that the Practitioner or I may terminate the telemedicine visit at any time. I understand that I have the right to inspect all information obtained and/or recorded in the course of the telemedicine visit and may receive copies of available information for a reasonable fee.  I understand that some of the potential risks of receiving the Services via telemedicine include:  Delay or interruption in medical evaluation due to technological equipment failure or disruption; Information transmitted may not be  sufficient (e.g. poor resolution of images) to allow for appropriate medical decision making by the Practitioner; and/or  In rare instances, security protocols could fail, causing a breach of personal health information.  Furthermore, I acknowledge that it is my responsibility to provide information about my medical history, conditions and care that is complete and accurate to the best of my ability. I acknowledge that Practitioner's advice, recommendations, and/or decision may be based on factors not within their control, such as incomplete or inaccurate data provided by me or distortions of diagnostic images or specimens that may result from electronic transmissions. I understand that the practice of medicine is not an exact science and that Practitioner makes no warranties or guarantees regarding treatment outcomes. I acknowledge that a copy of this consent can be made available to me via my patient portal ([facility]), or I can request a printed copy by calling the office of [facility].    I understand that my insurance will be billed for this visit.   I have read or had this consent read to me. I understand the contents of this consent, which adequately explains the benefits and risks of the Services being provided via telemedicine.  I have been provided ample opportunity to ask questions regarding this consent and the Services and have had my questions answered to my satisfaction. I give my informed consent for the services to be provided through the use of telemedicine in my medical care

## 2022-11-08 ENCOUNTER — Emergency Department (HOSPITAL_COMMUNITY): Payer: Medicare Other

## 2022-11-08 ENCOUNTER — Other Ambulatory Visit: Payer: Self-pay

## 2022-11-08 ENCOUNTER — Encounter (HOSPITAL_COMMUNITY): Payer: Self-pay

## 2022-11-08 ENCOUNTER — Emergency Department (HOSPITAL_COMMUNITY)
Admission: EM | Admit: 2022-11-08 | Discharge: 2022-11-08 | Disposition: A | Discharge status: Home / Self Care | Payer: Medicare Other | Attending: Emergency Medicine | Admitting: Emergency Medicine

## 2022-11-08 DIAGNOSIS — F419 Anxiety disorder, unspecified: Secondary | ICD-10-CM | POA: Diagnosis not present

## 2022-11-08 DIAGNOSIS — M25531 Pain in right wrist: Secondary | ICD-10-CM | POA: Diagnosis not present

## 2022-11-08 DIAGNOSIS — R0602 Shortness of breath: Secondary | ICD-10-CM | POA: Diagnosis not present

## 2022-11-08 HISTORY — DX: Unspecified asthma, uncomplicated: J45.909

## 2022-11-08 HISTORY — DX: Chronic obstructive pulmonary disease, unspecified: J44.9

## 2022-11-08 LAB — CBC
HCT: 43.5 % (ref 36.0–46.0)
Hemoglobin: 14 g/dL (ref 12.0–15.0)
MCH: 27.7 pg (ref 26.0–34.0)
MCHC: 32.2 g/dL (ref 30.0–36.0)
MCV: 86.1 fL (ref 80.0–100.0)
Platelets: 352 10*3/uL (ref 150–400)
RBC: 5.05 MIL/uL (ref 3.87–5.11)
RDW: 15.4 % (ref 11.5–15.5)
WBC: 11.1 10*3/uL — ABNORMAL HIGH (ref 4.0–10.5)
nRBC: 0 % (ref 0.0–0.2)

## 2022-11-08 LAB — RAPID URINE DRUG SCREEN, HOSP PERFORMED
Amphetamines: NOT DETECTED
Barbiturates: NOT DETECTED
Benzodiazepines: NOT DETECTED
Cocaine: POSITIVE — AB
Opiates: NOT DETECTED
Tetrahydrocannabinol: NOT DETECTED

## 2022-11-08 LAB — D-DIMER, QUANTITATIVE: D-Dimer, Quant: 0.87 ug/mL-FEU — ABNORMAL HIGH (ref 0.00–0.50)

## 2022-11-08 LAB — BASIC METABOLIC PANEL
Anion gap: 17 — ABNORMAL HIGH (ref 5–15)
BUN: 11 mg/dL (ref 8–23)
CO2: 21 mmol/L — ABNORMAL LOW (ref 22–32)
Calcium: 8.9 mg/dL (ref 8.9–10.3)
Chloride: 96 mmol/L — ABNORMAL LOW (ref 98–111)
Creatinine, Ser: 1.21 mg/dL — ABNORMAL HIGH (ref 0.44–1.00)
GFR, Estimated: 51 mL/min — ABNORMAL LOW (ref >60)
Glucose, Bld: 111 mg/dL — ABNORMAL HIGH (ref 70–99)
Potassium: 3.3 mmol/L — ABNORMAL LOW (ref 3.5–5.1)
Sodium: 134 mmol/L — ABNORMAL LOW (ref 135–145)

## 2022-11-08 LAB — TROPONIN I (HIGH SENSITIVITY)
Troponin I (High Sensitivity): 19 ng/L — ABNORMAL HIGH (ref <18)
Troponin I (High Sensitivity): 15 ng/L (ref <18)

## 2022-11-08 MED ORDER — IOHEXOL 350 MG/ML SOLN
65.0000 mL | Freq: Once | INTRAVENOUS | Status: AC | PRN
  Administered 2022-11-08: 65 mL via INTRAVENOUS

## 2022-11-08 NOTE — ED Provider Notes (Signed)
[facility] DEPT [facility] Emergency Department Provider Note MRN:  [id number]  Arrival date & time: 11/09/22     Chief Complaint   Shortness of Breath   History of Present Illness   Sandra Sanford is a 62 y.o. year-old female presents to the ED with chief complaint of SOB that started tonight.  Hx of PE.  Has not been completely compliant with her Xarelto.  States that she felt anxious and "overwhelmed" tonight and this caused her to feel SOB.  She denies having any chest pain.  She denies fever, chills, or cough.  History provided by patient.   Review of Systems  Pertinent positive and negative review of systems noted in HPI.    Physical Exam   Vitals:   11/08/22 0834 11/08/22 0835  BP:  (!) 122/48  Pulse: (!) 101   Resp:    Temp:  98.6 F (37 C)  SpO2: 98%     CONSTITUTIONAL: Anxious-appearing, NAD NEURO:  Alert and oriented x 3, CN 3-12 grossly intact EYES:  eyes equal and reactive ENT/NECK:  Supple, no stridor  CARDIO:  tachycardic, regular rhythm, appears well-perfused  PULM:  No respiratory distress, CTAB GI/GU:  non-distended,  MSK/SPINE:  No gross deformities, no edema, moves all extremities  SKIN:  no rash, atraumatic   *Additional and/or pertinent findings included in MDM below  Diagnostic and Interventional Summary    EKG Interpretation  Date/Time:  Sunday November 08 2022 03:28:57 EDT Ventricular Rate:  99 PR Interval:  183 QRS Duration: 99 QT Interval:  384 QTC Calculation: 493 R Axis:   -28 Text Interpretation: Sinus rhythm Low voltage, precordial leads Probable left ventricular hypertrophy Anterior Q waves, possibly due to LVH No significant change since last tracing Confirmed by Melene Plan [phone]) on 11/08/2022 3:44:06 AM       Labs Reviewed  BASIC METABOLIC PANEL - Abnormal; Notable for the following components:      Result Value   Sodium 134 (*)    Potassium 3.3 (*)    Chloride 96 (*)    CO2 21 (*)    Glucose, Bld 111 (*)     Creatinine, Ser 1.21 (*)    GFR, Estimated 51 (*)    Anion gap 17 (*)    All other components within normal limits  CBC - Abnormal; Notable for the following components:   WBC 11.1 (*)    All other components within normal limits  D-DIMER, QUANTITATIVE - Abnormal; Notable for the following components:   D-Dimer, Quant 0.87 (*)    All other components within normal limits  RAPID URINE DRUG SCREEN, HOSP PERFORMED - Abnormal; Notable for the following components:   Cocaine POSITIVE (*)    All other components within normal limits  TROPONIN I (HIGH SENSITIVITY) - Abnormal; Notable for the following components:   Troponin I (High Sensitivity) 19 (*)    All other components within normal limits  TROPONIN I (HIGH SENSITIVITY)    DG Wrist Complete Left  Final Result    CT Angio Chest PE W and/or Wo Contrast  Final Result    DG Chest 2 View  Final Result      Medications  iohexol (OMNIPAQUE) 350 MG/ML injection 65 mL (65 mLs Intravenous Contrast Given 11/08/22 1914)     Procedures  /  Critical Care Procedures  ED Course and Medical Decision Making  I have reviewed the triage vital signs, the nursing notes, and pertinent available records from the EMR.  Social Determinants Affecting  Complexity of Care: Patient has no clinically significant social determinants affecting this chief complaint..   ED Course:    Medical Decision Making Patient here with SOB.  States that she felt "overwhelmed." Seems quite anxious.  Was reportedly fairly agitated earlier per family, but has calmed down now.  Hx of PE and non-compliance on xarelto.  Will check labs and imaging.  CT PE study is negative for PE.  Will recommend that patient continue taking her Xarelto.    Case discussed with Dr. Adela Lank, who agrees with plan for discharge if repeat trop is flat.  I have a low suspicion for ACS.  Patient history and exam is most consistent with anxiety/stress reaction.  She appears stable for discharge  and outpatient follow-up.  Amount and/or Complexity of Data Reviewed Labs: ordered.    Details: D-dimer is elevated at 0.87, will check CT PE Trop 19, will trend.  No chest pain. Repeat trop is 15.   UDS positive for cocaine.  Radiology: ordered and independent interpretation performed.    Details: No obvious opacity on CXR No large PE on CT ECG/medicine tests: ordered and independent interpretation performed.    Details: NSR, no acute ischemic changes  Risk Prescription drug management.     Consultants: No consultations were needed in caring for this patient.   Treatment and Plan: I considered admission due to patient's initial presentation, but after considering the examination and diagnostic results, patient will not require admission and can be discharged with outpatient follow-up.  Patient seen by and discussed with attending physician, Dr. Adela Lank, who agrees with plan.  Final Clinical Impressions(s) / ED Diagnoses     ICD-10-CM   1. Anxiousness  F41.9     2. Shortness of breath  R06.02     3. Right wrist pain  M25.531       ED Discharge Orders     None         Discharge Instructions Discussed with and Provided to Patient:   Discharge Instructions   None      Roxy Horseman, PA-C 11/09/22 0140    Melene Plan, DO 11/11/22 [phone]

## 2022-11-08 NOTE — ED Triage Notes (Signed)
Pt BIB EMS from home with c/o SOB since 0000 tonight. Pt took prescribed xanax and drank moonshine tonight. Hx of DVT. Pt not taking Xarelto as prescribed. Pt 93% on room air and was placed on 2L O2 by EMS. 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 EMS reports pt with "odd behavior".

## 2022-11-08 NOTE — ED Notes (Signed)
X-ray at bedside

## 2022-11-08 NOTE — ED Notes (Signed)
O2 removed from pt.

## 2022-11-08 NOTE — ED Provider Notes (Addendum)
7:42 AM Troponin 19 >> 15, UDS + cocaine.   Plan for dispo per previous team.   BP (!) 110/50 (BP Location: Right Arm)   Pulse (!) 105   Temp 97.9 F (36.6 C) (Oral)   Resp 18   Ht '5\' 7"'$  (1.702 m)   Wt 104.3 kg   SpO2 94%   BMI 36.02 kg/m    7:54 AM At discharge, patient requesting x-ray for her wrist 2/2 fall. X-ray ordered.   8:38 AM x-ray personally reviewed and interpreted.  I agree no signs of fracture today.  Patient updated on results.  Counseled on RICE protocol.  Of note, patient has had some spurious vital signs with pulse rate of 170 and oxygen saturation of 64% documented over the past 1 hour.  Vital signs were rechecked with blood pressure 122/48, pulse rate 101, pulse ox 98% on room air.  I suspect these are accurate as these are in line with previous readings and other vital signs were erroneous.     Carlisle Cater, PA-C 11/08/22 0743    Carlisle Cater, PA-C 11/08/22 [id number]    Tegeler, Gwenyth Allegra, MD 11/08/22 1009
